# Patient Record
Sex: Female | Born: 1990 | Race: White | Hispanic: No | Marital: Married | Smoking: Former smoker
Health system: Southern US, Community
[De-identification: ages and names within clinical notes are randomized; demographics above are authoritative.]

## PROBLEM LIST (undated history)

## (undated) DIAGNOSIS — Z9889 Other specified postprocedural states: Secondary | ICD-10-CM

## (undated) DIAGNOSIS — F419 Anxiety disorder, unspecified: Secondary | ICD-10-CM

## (undated) DIAGNOSIS — K219 Gastro-esophageal reflux disease without esophagitis: Secondary | ICD-10-CM

## (undated) DIAGNOSIS — F329 Major depressive disorder, single episode, unspecified: Secondary | ICD-10-CM

## (undated) DIAGNOSIS — T8859XA Other complications of anesthesia, initial encounter: Secondary | ICD-10-CM

## (undated) DIAGNOSIS — Z720 Tobacco use: Secondary | ICD-10-CM

## (undated) DIAGNOSIS — E669 Obesity, unspecified: Secondary | ICD-10-CM

## (undated) DIAGNOSIS — E785 Hyperlipidemia, unspecified: Secondary | ICD-10-CM

## (undated) DIAGNOSIS — F32A Depression, unspecified: Secondary | ICD-10-CM

## (undated) DIAGNOSIS — J45909 Unspecified asthma, uncomplicated: Secondary | ICD-10-CM

## (undated) DIAGNOSIS — R079 Chest pain, unspecified: Secondary | ICD-10-CM

## (undated) DIAGNOSIS — Z5189 Encounter for other specified aftercare: Secondary | ICD-10-CM

## (undated) DIAGNOSIS — M5412 Radiculopathy, cervical region: Secondary | ICD-10-CM

## (undated) DIAGNOSIS — D649 Anemia, unspecified: Secondary | ICD-10-CM

## (undated) HISTORY — DX: Depression, unspecified: F32.A

## (undated) HISTORY — DX: Unspecified asthma, uncomplicated: J45.909

## (undated) HISTORY — DX: Gastro-esophageal reflux disease without esophagitis: K21.9

## (undated) HISTORY — DX: Major depressive disorder, single episode, unspecified: F32.9

## (undated) HISTORY — DX: Anxiety disorder, unspecified: F41.9

## (undated) HISTORY — DX: Encounter for other specified aftercare: Z51.89

## (undated) HISTORY — DX: Hyperlipidemia, unspecified: E78.5

---

## 2011-06-20 ENCOUNTER — Ambulatory Visit (INDEPENDENT_AMBULATORY_CARE_PROVIDER_SITE_OTHER): Payer: BC Managed Care – PPO

## 2011-06-20 DIAGNOSIS — R509 Fever, unspecified: Secondary | ICD-10-CM

## 2011-06-20 DIAGNOSIS — J40 Bronchitis, not specified as acute or chronic: Secondary | ICD-10-CM

## 2011-06-20 DIAGNOSIS — R05 Cough: Secondary | ICD-10-CM

## 2011-06-20 DIAGNOSIS — F17200 Nicotine dependence, unspecified, uncomplicated: Secondary | ICD-10-CM

## 2011-06-27 ENCOUNTER — Ambulatory Visit (INDEPENDENT_AMBULATORY_CARE_PROVIDER_SITE_OTHER): Payer: BC Managed Care – PPO | Admitting: Family Medicine

## 2011-06-27 VITALS — BP 108/66 | HR 76 | Temp 98.9°F | Resp 18 | Ht 63.0 in | Wt 160.0 lb

## 2011-06-27 DIAGNOSIS — N912 Amenorrhea, unspecified: Secondary | ICD-10-CM

## 2011-06-27 DIAGNOSIS — J45909 Unspecified asthma, uncomplicated: Secondary | ICD-10-CM | POA: Insufficient documentation

## 2011-06-27 NOTE — Patient Instructions (Addendum)
Nosebleed Nosebleeds can be caused by many conditions including trauma, infections, polyps, foreign bodies, dry mucous membranes or climate, medications and air conditioning. Most nosebleeds occur in the front of the nose. It is because of this location that most nosebleeds can be controlled by pinching the nostrils gently and continuously. Do this for at least 10 to 20 minutes. The reason for this long continuous pressure is that you must hold it long enough for the blood to clot. If during that 10 to 20 minute time period, pressure is released, the process may have to be started again. The nosebleed may stop by itself, quit with pressure, need concentrated heating (cautery) or stop with pressure from packing. HOME CARE INSTRUCTIONS   If your nose was packed, try to maintain the pack inside until your caregiver removes it. If a gauze pack was used and it starts to fall out, gently replace or cut the end off. Do not cut if a balloon catheter was used to pack the nose. Otherwise, do not remove unless instructed.   Avoid blowing your nose for 12 hours after treatment. This could dislodge the pack or clot and start bleeding again.   If the bleeding starts again, sit up and bending forward, gently pinch the front half of your nose continuously for 20 minutes.   If bleeding was caused by dry mucous membranes, cover the inside of your nose every morning with a petroleum or antibiotic ointment. Use your little fingertip as an applicator. Do this as needed during dry weather. This will keep the mucous membranes moist and allow them to heal.   Maintain humidity in your home by using less air conditioning or using a humidifier.   Do not use aspirin or medications which make bleeding more likely. Your caregiver can give you recommendations on this.   Resume normal activities as able but try to avoid straining, lifting or bending at the waist for several days.   If the nosebleeds become recurrent and the cause  is unknown, your caregiver may suggest laboratory tests.  SEEK IMMEDIATE MEDICAL CARE IF:   Bleeding recurs and cannot be controlled.   There is unusual bleeding from or bruising on other parts of the body.   You have a fever.   Nosebleeds continue.   There is any worsening of the condition which originally brought you in.   You become lightheaded, feel faint, become sweaty or vomit blood.  MAKE SURE YOU:   Understand these instructions.   Will watch your condition.   Will get help right away if you are not doing well or get worse.  Document Released: 02/21/2005 Document Revised: 01/24/2011 Document Reviewed: 04/15/2009 [facility] Patient Information 2012 [place], [place].Pregnancy Tests HOW DO PREGNANCY TESTS WORK? All pregnancy tests look for a special hormone in the urine or blood that is only present in pregnant women. This hormone, human chorionic gonadotropin (hCG), is also called the pregnancy hormone.  WHAT IS THE DIFFERENCE BETWEEN A URINE AND A BLOOD PREGNANCY TEST? IS ONE BETTER THAN THE OTHER? There are two types of pregnancy tests.  Blood tests.   Urine tests.  Both tests look for the presence of hCG, the pregnancy hormone. Many women use a urine test or home pregnancy test (HPT) to find out if they are pregnant. HPTs are cheap, easy to use, can be done at home, and are private. When a woman has a positive result on an HPT, she needs to see her caregiver right away. The caregiver can confirm  a positive HPT result with another urine test, a blood test, ultrasound, and a pelvic exam.  There are two types of blood tests you can get from a caregiver.   A quantitative blood test (or the beta hCG test). This test measures the exact amount of hCG in the blood. This means it can pick up very small amounts of hCG, making it a very accurate test.   A qualitative hCG blood test. This test gives a simple yes or no answer to whether you are pregnant. This test is more like a urine  test in terms of its accuracy.  Blood tests can pick up hCG earlier in a pregnancy than urine tests can. Blood tests can tell if you are pregnant about 6 to 8 days after you release an egg from an ovary (ovulate). Urine tests can determine pregnancy about 2 weeks after ovulation. Some more sensitive urine tests can tell if you are pregnant as early as 6 days or even 1 day after you miss a menstrual period.  HOW IS A HOME PREGNANCY TEST DONE?  There are many types of home pregnancy tests or HPTs that can be bought over-the-counter at drug or discount stores.   Some involve collecting your urine in a cup and dipping a stick into the urine or putting some of the urine into a special container with an eyedropper.   Others are done by placing a stick into your urine stream.   Tests vary in how long you need to wait for the stick or container to turn a certain color or have a symbol on it (like a plus or a minus).   All tests come with written instructions. Most tests also have toll-free phone numbers to call if you have any questions about how to do the test or read the results.  HOW ACCURATE ARE HOME PREGNANCY TESTS?  HPTs are very accurate. Most brands of HPTs say they are 97% to 99% accurate when taken 1 week after missing your menstrual period, but this can vary with actual use. Each brand varies in how sensitive it is in picking up the pregnancy hormone hCG. If a test is not done correctly, it will be less accurate. Always check the package to make sure it is not past its expiration date. If it is, it will not be accurate. Most brands of HPTs tell users to do the test again in a few days, no matter what the results.  If you use an HPT too early in your pregnancy, you may not have enough of the pregnancy hormone hCG in your urine to have a positive test result. Most HPTs will be accurate if you test yourself around the time your period is due (about 2 weeks after you ovulate). You can get a negative  test result if you are not pregnant or if you ovulated later than you thought you did. You may also have problems with the pregnancy, which affects the amount of hCG you have in your urine. If your HPT is negative, test yourself again within a few days to 1 week. If you keep getting a negative result and think you are pregnant, talk with your caregiver right away about getting a blood pregnancy test.  FALSE POSITIVE PREGNANCY TEST A false positive HPT can happen if there is blood or protein present in your urine. A false positive can also happen if you were recently pregnant or if you take a pregnancy test too soon after taking fertility drug  that contains hCG. Also, some prescription medicines such as water pills (diuretics), tranquilizers, seizure medicines, psychiatric medicines, and allergy and nausea medicines (promethazine) give false positive readings. FALSE NEGATIVE PREGNANCY TEST  A false negative HPT can happen if you do the test too early. Try to wait until you are at least 1 day late for your menstrual period.   It may happen if you wait too long to test the urine (longer than 15 minutes).   It may also happen if the urine is too diluted because you drank a lot of fluids before getting the urine sample. It is best to test the first morning urine after you get out of bed.  If your menstrual period did not start after a week of a negative HPT, repeat the pregnancy test. CAN ANYTHING INTERFERE WITH HOME PREGNANCY TEST RESULTS?  Most medicines, both over-the-counter and prescription drugs, including birth control pills and antibiotics, should not affect the results of a HPT. Only those drugs that have the pregnancy hormone hCG in them can give a false positive test result. Drugs that have hCG in them may be used for treating infertility (not being able to get pregnant). Alcohol and illegal drugs do not affect HPT results, but you should not be using these substances if you are trying to get  pregnant. If you have a positive pregnancy test, call your caregiver to make an appointment to begin prenatal care. Document Released: 05/17/2003 Document Revised: 01/24/2011 Document Reviewed: 07/28/2010 [facility] Patient Information 2012 [place], [place].

## 2011-06-27 NOTE — Progress Notes (Signed)
  Subjective:    Patient ID: Molly Gibbs, female    DOB: 01-23-1991, 23 y.o.   MRN: [id number]  Epistaxis  The bleeding has been from the right nare. This is a new problem. The current episode started in the past 7 days. The problem occurs daily. The problem has been unchanged. The bleeding is associated with nothing. She has tried nothing for the symptoms. There is no history of allergies, a bleeding disorder or frequent nosebleeds.   Patient has also had some insomnia and amenorrhea x 2 months but feels that her insomnia doesn't need treatment (she has taken the oxycodone for help sleeping).   Review of Systems  HENT: Positive for nosebleeds.   Gastrointestinal: Abdominal distention: Patient has had nausea and vomiting.  All other systems reviewed and are negative.       Objective:   Physical Exam  Constitutional: She appears well-developed and well-nourished.  Eyes: Conjunctivae are normal. Pupils are equal, round, and reactive to light.  Neck: Normal range of motion. Neck supple.  Abdominal: Soft. She exhibits no distension and no mass. There is no tenderness. There is no rebound and no guarding.  Skin: Skin is warm.          Assessment & Plan:  Epistaxis:  Uncertain cause but appears to be under control now Nausea and vomiting are mild, just in am:  Cause may be the oxycodone. Amenorrhea:  If this persists one more week, patient will call me and I will call in some treatment

## 2011-06-28 ENCOUNTER — Encounter: Payer: Self-pay | Admitting: Family Medicine

## 2011-06-28 LAB — HCG, SERUM, QUALITATIVE: Preg, Serum: NEGATIVE

## 2012-06-27 ENCOUNTER — Ambulatory Visit: Payer: BC Managed Care – PPO

## 2012-06-27 ENCOUNTER — Ambulatory Visit (INDEPENDENT_AMBULATORY_CARE_PROVIDER_SITE_OTHER): Payer: BC Managed Care – PPO | Admitting: Family Medicine

## 2012-06-27 ENCOUNTER — Encounter: Payer: Self-pay | Admitting: Family Medicine

## 2012-06-27 VITALS — BP 102/68 | HR 69 | Temp 98.1°F | Resp 16 | Ht 63.0 in | Wt 155.4 lb

## 2012-06-27 DIAGNOSIS — F17200 Nicotine dependence, unspecified, uncomplicated: Secondary | ICD-10-CM

## 2012-06-27 DIAGNOSIS — Z720 Tobacco use: Secondary | ICD-10-CM

## 2012-06-27 DIAGNOSIS — M25569 Pain in unspecified knee: Secondary | ICD-10-CM | POA: Insufficient documentation

## 2012-06-27 LAB — CBC WITH DIFFERENTIAL/PLATELET
Basophils Absolute: 0 10*3/uL (ref 0.0–0.1)
Basophils Relative: 1 % (ref 0–1)
MCHC: 33.9 g/dL (ref 30.0–36.0)
Monocytes Absolute: 0.4 10*3/uL (ref 0.1–1.0)
Neutro Abs: 3.3 10*3/uL (ref 1.7–7.7)
Neutrophils Relative %: 60 % (ref 43–77)
RDW: 12.7 % (ref 11.5–15.5)

## 2012-06-27 LAB — COMPREHENSIVE METABOLIC PANEL
ALT: 9 U/L (ref 0–35)
Albumin: 4.4 g/dL (ref 3.5–5.2)
BUN: 15 mg/dL (ref 6–23)
CO2: 27 mEq/L (ref 19–32)
Calcium: 9.6 mg/dL (ref 8.4–10.5)
Chloride: 106 mEq/L (ref 96–112)
Creat: 0.61 mg/dL (ref 0.50–1.10)
Potassium: 4.5 mEq/L (ref 3.5–5.3)

## 2012-06-27 LAB — RHEUMATOID FACTOR: Rhuematoid fact SerPl-aCnc: <10 IU/mL (ref <=14)

## 2012-06-27 LAB — SEDIMENTATION RATE: Sed Rate: 9 mm/hr (ref 0–22)

## 2012-06-27 MED ORDER — PREDNISONE (PAK) 5 MG PO TABS: 5.0000 mg | ORAL_TABLET | Freq: Four times a day (QID) | ORAL | Status: DC

## 2012-06-27 MED ORDER — MELOXICAM 15 MG PO TABS: 15.0000 mg | ORAL_TABLET | Freq: Every day | ORAL | Status: DC

## 2012-06-27 NOTE — Patient Instructions (Signed)
Prednisone dose pack has been prescribed for knee pain. On day 1- take 1 tablet 4 times a day with food. Take 1 less tablet each day until you have no tablets left. You have enough tablets for 4 days. Meloxicam is for pain and inflammation; take 1 tablet daily with food or snack. Do not take Ibuprofen while you are taking this medication.  You can also apply topical pain medication like Arnica Gel or Tiger Balm 2-3 times daily.

## 2012-06-27 NOTE — Progress Notes (Signed)
S:  This 22 y.o. Cauc female has new onset of bilateral knee pain, R>L. She served in the [employer] for 5 years and had no problems with joint pain. Currently, her job involves admin work w/ [employer]; she does a lot of walking around the [employer]. She exercises 1-2 times/week, doing cardio. Advil relieves the deep ache in her knees, but not the sharp pains. She c/o "soreness" in her hands but no hip, ankle, elbow or other major joint discomfort. She denies redness, swelling or warmth in joints. She has not been febrile or had HA, rash, n/v/d or any other symptoms.  Family HX is significant for Rheumatoid arthritis and OA.  O:  Filed Vitals:   06/27/12 0812  BP: 102/68  Pulse: 69  Temp: 98.1 F (36.7 C)  Resp: 16   GEN: In NAD; WN/WD. HENT: [place]/AT; EOMI w/ clear conj/sclerae. Oroph clear and moist. COR: RRR. LUNGS: Normal resp rate and effort. SKIN: W&D. No rashes or pallor. MS: R knee- normal appearance; no effusion, redness or warmth. Tender w/ minimal palpation at joint line and above patella; + patella compression test. FROM. No ligament laxity or joint instability.        L  Knee- normal appearance; no effusion, redness or warmth. No tender areas. Negative patella compression test. No ligament laxity or joint instability.        Pt does not bear weight fully on R leg.        Other major joints are normal. NEURO: A&O x 3; CNs intact. Nonfocal.   [facility] reading (PRIMARY) by  Dr. Audria Nine: R knee- no fracture or dislocation. Normal.   A/P:  1. Knee pain  DG Knee 1-2 Views Right, Comprehensive metabolic panel, Sedimentation Rate, Vitamin D, 25-hydroxy, CBC with Differential, Rheumatoid factor  2. Tobacco use - pt started smoking Jan 2013. Encouraged cessation.    Meds ordered this encounter  Medications         . meloxicam (MOBIC) 15 MG tablet    Sig: Take 1 tablet (15 mg total) by mouth daily.    Dispense:  30 tablet    Refill:  0  . predniSONE (STERAPRED UNI-PAK) 5  MG TABS    Sig: Take 1 tablet (5 mg total) by mouth taper from 4 doses each day to 1 dose and stop. Take all doses with meal or snack.    Dispense:  10 tablet    Refill:  0

## 2012-06-29 ENCOUNTER — Other Ambulatory Visit: Payer: Self-pay | Admitting: Family Medicine

## 2012-06-29 MED ORDER — ERGOCALCIFEROL 1.25 MG (50000 UT) PO CAPS: 50000.0000 [IU] | ORAL_CAPSULE | ORAL | Status: DC

## 2012-06-29 NOTE — Progress Notes (Signed)
Quick Note:  Please call pt and advise that the following labs are abnormal... Vitamin level is very low; I am prescribing Vitamin D 91478 units to be taken once a week. This medication is at her pharmacy for pick-up.  All other lab tests are normal; blood test for Rheumatoid arthritis is negative.  Copy to pt. ______

## 2012-07-11 ENCOUNTER — Ambulatory Visit: Payer: BC Managed Care – PPO | Admitting: Family Medicine

## 2012-10-13 ENCOUNTER — Other Ambulatory Visit: Payer: Self-pay

## 2012-10-24 ENCOUNTER — Other Ambulatory Visit (HOSPITAL_COMMUNITY): Payer: Self-pay | Admitting: Obstetrics

## 2012-10-24 DIAGNOSIS — O2691 Pregnancy related conditions, unspecified, first trimester: Secondary | ICD-10-CM

## 2012-10-27 ENCOUNTER — Ambulatory Visit (HOSPITAL_COMMUNITY)
Admission: RE | Admit: 2012-10-27 | Discharge: 2012-10-27 | Disposition: A | Discharge status: Home / Self Care | Payer: BC Managed Care – PPO | Source: Ambulatory Visit | Attending: Obstetrics | Admitting: Obstetrics

## 2012-10-27 ENCOUNTER — Encounter (HOSPITAL_COMMUNITY): Payer: Self-pay

## 2012-10-27 ENCOUNTER — Other Ambulatory Visit (HOSPITAL_COMMUNITY): Payer: Self-pay | Admitting: Obstetrics

## 2012-10-27 ENCOUNTER — Other Ambulatory Visit: Payer: Self-pay | Admitting: Obstetrics

## 2012-10-27 DIAGNOSIS — O2691 Pregnancy related conditions, unspecified, first trimester: Secondary | ICD-10-CM

## 2012-10-27 DIAGNOSIS — O209 Hemorrhage in early pregnancy, unspecified: Secondary | ICD-10-CM | POA: Insufficient documentation

## 2012-10-27 DIAGNOSIS — O34219 Maternal care for unspecified type scar from previous cesarean delivery: Secondary | ICD-10-CM | POA: Insufficient documentation

## 2012-10-27 NOTE — Progress Notes (Signed)
MFM Staff Consult Note  I saw and evaluated your patient, Molly Gibbs, today in the office.  We performed an ultrasound that demonstrated the following impressions:  1. Nonviable Singleton IUP  2. CRL measures less than dates as established by early ultrasound 3. No fetal heart motion is present as confirmed by color angiography and m-mode 4. Multiple cystic spaces in placenta leading to a suspicion for aneuploidy or partial molar pregnancy 5. Review of BHCG (1517 on 5/14, 1707 on 5/16, 3543 on 5/22, 4148 on 5/23) in context of images lead to high suspicion for intrinsic fetal disease/aneuploidy  Recommendations:  Today's findings and recommendations were reviewed and discussed with your patient at bedside in our unit.  She reported some vaginal bleeding over the weekend but simply some light spotting today.  I contacted Dr. Ernestina Penna to discuss my recommendation for D&C replete with tissue being sent to pathology for analysis and karyotyping.  Dr. Ernestina Penna agreed with such plan and will schedule within the next day or so with the patient (ie, by the end of this week at the latest). Management will be predicated by karyotype (eg, partial molar pregnancy versus aneuploidy), noting our practice will remain available for further questions.  Thank you for consultation.  It was a pleasure seeing your patient in the office.  See [facility].  Rogelia Boga, MD, MS, [facility]

## 2012-10-28 ENCOUNTER — Encounter (HOSPITAL_COMMUNITY): Payer: Self-pay | Admitting: Pharmacist

## 2012-10-29 ENCOUNTER — Ambulatory Visit (HOSPITAL_COMMUNITY)
Admission: RE | Admit: 2012-10-29 | Discharge: 2012-10-29 | Disposition: A | Discharge status: Home / Self Care | Payer: BC Managed Care – PPO | Source: Ambulatory Visit | Attending: Obstetrics | Admitting: Obstetrics

## 2012-10-29 ENCOUNTER — Ambulatory Visit (HOSPITAL_COMMUNITY): Payer: BC Managed Care – PPO | Admitting: Certified Registered"

## 2012-10-29 ENCOUNTER — Encounter (HOSPITAL_COMMUNITY): Payer: Self-pay | Admitting: *Deleted

## 2012-10-29 ENCOUNTER — Encounter (HOSPITAL_COMMUNITY)
Admission: RE | Disposition: A | Discharge status: Home / Self Care | Payer: Self-pay | Source: Ambulatory Visit | Attending: Obstetrics

## 2012-10-29 ENCOUNTER — Ambulatory Visit (HOSPITAL_COMMUNITY): Payer: BC Managed Care – PPO

## 2012-10-29 ENCOUNTER — Encounter (HOSPITAL_COMMUNITY): Payer: Self-pay | Admitting: Certified Registered"

## 2012-10-29 ENCOUNTER — Other Ambulatory Visit: Payer: Self-pay | Admitting: Obstetrics

## 2012-10-29 DIAGNOSIS — O021 Missed abortion: Secondary | ICD-10-CM | POA: Insufficient documentation

## 2012-10-29 HISTORY — PX: DILATION AND EVACUATION: SHX1459

## 2012-10-29 LAB — CBC
HCT: 36.3 % (ref 36.0–46.0)
MCH: 30 pg (ref 26.0–34.0)
MCV: 87.1 fL (ref 78.0–100.0)
RDW: 12.3 % (ref 11.5–15.5)
WBC: 7.1 10*3/uL (ref 4.0–10.5)

## 2012-10-29 LAB — TYPE AND SCREEN

## 2012-10-29 SURGERY — DILATION AND EVACUATION, UTERUS
Anesthesia: Monitor Anesthesia Care | Site: Uterus | Wound class: Clean Contaminated

## 2012-10-29 MED ORDER — HYDROCODONE-ACETAMINOPHEN 5-325 MG PO TABS: 2.0000 | ORAL_TABLET | Freq: Four times a day (QID) | ORAL | Status: DC | PRN

## 2012-10-29 MED ORDER — MIDAZOLAM HCL 2 MG/2ML IJ SOLN
INTRAMUSCULAR | Status: AC
  Filled 2012-10-29: qty 2

## 2012-10-29 MED ORDER — DOXYCYCLINE HYCLATE 100 MG IV SOLR: 100.0000 mg | Freq: Two times a day (BID) | INTRAVENOUS | Status: DC

## 2012-10-29 MED ORDER — ONDANSETRON HCL 4 MG/2ML IJ SOLN
INTRAMUSCULAR | Status: DC | PRN
  Administered 2012-10-29: 4 mg via INTRAVENOUS

## 2012-10-29 MED ORDER — DOXYCYCLINE HYCLATE 100 MG IV SOLR
250.0000 mg | INTRAVENOUS | Status: DC | PRN
  Administered 2012-10-29: 100 mg via INTRAVENOUS

## 2012-10-29 MED ORDER — DOXYCYCLINE HYCLATE 100 MG IV SOLR
100.0000 mg | Freq: Once | INTRAVENOUS | Status: DC
  Filled 2012-10-29 (×2): qty 100

## 2012-10-29 MED ORDER — FENTANYL CITRATE 0.05 MG/ML IJ SOLN: 25.0000 ug | INTRAMUSCULAR | Status: DC | PRN

## 2012-10-29 MED ORDER — SILVER NITRATE-POT NITRATE 75-25 % EX MISC
CUTANEOUS | Status: DC | PRN
  Administered 2012-10-29: 7

## 2012-10-29 MED ORDER — PROPOFOL 10 MG/ML IV EMUL
INTRAVENOUS | Status: AC
  Filled 2012-10-29: qty 20

## 2012-10-29 MED ORDER — LIDOCAINE HCL (CARDIAC) 20 MG/ML IV SOLN
INTRAVENOUS | Status: AC
Start: 2012-10-29 — End: 2012-10-29
  Filled 2012-10-29: qty 5

## 2012-10-29 MED ORDER — OXYCODONE-ACETAMINOPHEN 5-325 MG PO TABS: 2.0000 | ORAL_TABLET | ORAL | Status: DC | PRN

## 2012-10-29 MED ORDER — MEPERIDINE HCL 25 MG/ML IJ SOLN: 6.2500 mg | INTRAMUSCULAR | Status: DC | PRN

## 2012-10-29 MED ORDER — LIDOCAINE HCL (CARDIAC) 20 MG/ML IV SOLN
INTRAVENOUS | Status: DC | PRN
  Administered 2012-10-29: 80 mg via INTRAVENOUS
  Administered 2012-10-29: 20 mg via INTRAVENOUS

## 2012-10-29 MED ORDER — FENTANYL CITRATE 0.05 MG/ML IJ SOLN
INTRAMUSCULAR | Status: AC
  Filled 2012-10-29: qty 2

## 2012-10-29 MED ORDER — DEXAMETHASONE SODIUM PHOSPHATE 10 MG/ML IJ SOLN
INTRAMUSCULAR | Status: AC
  Filled 2012-10-29: qty 1

## 2012-10-29 MED ORDER — MIDAZOLAM HCL 2 MG/2ML IJ SOLN: 0.5000 mg | Freq: Once | INTRAMUSCULAR | Status: DC | PRN

## 2012-10-29 MED ORDER — KETOROLAC TROMETHAMINE 30 MG/ML IJ SOLN
INTRAMUSCULAR | Status: AC
  Administered 2012-10-29: 30 mg via INTRAVENOUS
  Filled 2012-10-29: qty 1

## 2012-10-29 MED ORDER — FENTANYL CITRATE 0.05 MG/ML IJ SOLN
INTRAMUSCULAR | Status: DC | PRN
  Administered 2012-10-29 (×2): 50 ug via INTRAVENOUS

## 2012-10-29 MED ORDER — LIDOCAINE HCL 1 % IJ SOLN
INTRAMUSCULAR | Status: DC | PRN
  Administered 2012-10-29: 20 mL

## 2012-10-29 MED ORDER — DEXAMETHASONE SODIUM PHOSPHATE 4 MG/ML IJ SOLN
INTRAMUSCULAR | Status: DC | PRN
  Administered 2012-10-29: 10 mg via INTRAVENOUS

## 2012-10-29 MED ORDER — MIDAZOLAM HCL 5 MG/5ML IJ SOLN
INTRAMUSCULAR | Status: DC | PRN
  Administered 2012-10-29 (×2): 2 mg via INTRAVENOUS

## 2012-10-29 MED ORDER — PROMETHAZINE HCL 25 MG/ML IJ SOLN: 6.2500 mg | INTRAMUSCULAR | Status: DC | PRN

## 2012-10-29 MED ORDER — ONDANSETRON HCL 4 MG/2ML IJ SOLN
INTRAMUSCULAR | Status: AC
  Filled 2012-10-29: qty 2

## 2012-10-29 MED ORDER — KETOROLAC TROMETHAMINE 30 MG/ML IJ SOLN: 15.0000 mg | Freq: Once | INTRAMUSCULAR | Status: AC | PRN

## 2012-10-29 MED ORDER — LACTATED RINGERS IV SOLN
INTRAVENOUS | Status: DC
Start: 2012-10-29 — End: 2012-10-29
  Administered 2012-10-29: 13:00:00 via INTRAVENOUS

## 2012-10-29 MED ORDER — PROPOFOL 10 MG/ML IV BOLUS
INTRAVENOUS | Status: DC | PRN
  Administered 2012-10-29 (×7): 20 mg via INTRAVENOUS

## 2012-10-29 SURGICAL SUPPLY — 24 items
CATH ROBINSON RED A/P 16FR (CATHETERS) ×2 IMPLANT
CLOTH BEACON ORANGE TIMEOUT ST (SAFETY) ×2 IMPLANT
DECANTER SPIKE VIAL GLASS SM (MISCELLANEOUS) ×4 IMPLANT
GLOVE BIO SURGEON STRL SZ 6.5 (GLOVE) ×2 IMPLANT
GLOVE BIOGEL PI IND STRL 7.0 (GLOVE) ×5 IMPLANT
GLOVE BIOGEL PI INDICATOR 7.0 (GLOVE) ×5
GLOVE SURG SS PI 6.5 STRL IVOR (GLOVE) ×14 IMPLANT
GLOVE SURG SS PI 7.0 STRL IVOR (GLOVE) ×6 IMPLANT
GOWN STRL REIN XL XLG (GOWN DISPOSABLE) ×4 IMPLANT
KIT BERKELEY 1ST TRIMESTER 3/8 (MISCELLANEOUS) ×2 IMPLANT
NEEDLE SPNL 22GX3.5 QUINCKE BK (NEEDLE) ×2 IMPLANT
NS IRRIG 1000ML POUR BTL (IV SOLUTION) ×2 IMPLANT
PACK VAGINAL MINOR WOMEN LF (CUSTOM PROCEDURE TRAY) ×2 IMPLANT
PAD OB MATERNITY 4.3X12.25 (PERSONAL CARE ITEMS) ×2 IMPLANT
PAD PREP 24X48 CUFFED NSTRL (MISCELLANEOUS) ×2 IMPLANT
SET BERKELEY SUCTION TUBING (SUCTIONS) ×2 IMPLANT
SYR BULB IRRIGATION 50ML (SYRINGE) ×2 IMPLANT
SYR CONTROL 10ML LL (SYRINGE) ×2 IMPLANT
TOWEL OR 17X24 6PK STRL BLUE (TOWEL DISPOSABLE) ×4 IMPLANT
VACURETTE 10 RIGID CVD (CANNULA) IMPLANT
VACURETTE 14MM CVD 1/2 BASE (CANNULA) ×2 IMPLANT
VACURETTE 7MM CVD STRL WRAP (CANNULA) ×2 IMPLANT
VACURETTE 8 RIGID CVD (CANNULA) IMPLANT
VACURETTE 9 RIGID CVD (CANNULA) IMPLANT

## 2012-10-29 NOTE — Anesthesia Preprocedure Evaluation (Signed)
Anesthesia Evaluation  Patient identified by MRN, date of birth, ID band Patient awake    Reviewed: Allergy & Precautions, H&P , Patient's Chart, lab work & pertinent test results, reviewed documented beta blocker date and time   History of Anesthesia Complications Negative for: history of anesthetic complications  Airway Mallampati: II TM Distance: >3 FB Neck ROM: full    Dental no notable dental hx.    Pulmonary neg pulmonary ROS, asthma ,  breath sounds clear to auscultation  Pulmonary exam normal       Cardiovascular Exercise Tolerance: Good negative cardio ROS  Rhythm:regular Rate:Normal     Neuro/Psych negative neurological ROS  negative psych ROS   GI/Hepatic negative GI ROS, Neg liver ROS,   Endo/Other  negative endocrine ROS  Renal/GU negative Renal ROS     Musculoskeletal   Abdominal   Peds  Hematology negative hematology ROS (+)   Anesthesia Other Findings   Reproductive/Obstetrics negative OB ROS                           Anesthesia Physical Anesthesia Plan  ASA: II  Anesthesia Plan: MAC   Post-op Pain Management:    Induction:   Airway Management Planned:   Additional Equipment:   Intra-op Plan:   Post-operative Plan:   Informed Consent: I have reviewed the patients History and Physical, chart, labs and discussed the procedure including the risks, benefits and alternatives for the proposed anesthesia with the patient or authorized representative who has indicated his/her understanding and acceptance.   Dental Advisory Given  Plan Discussed with: CRNA, Surgeon and Anesthesiologist  Anesthesia Plan Comments:         Anesthesia Quick Evaluation

## 2012-10-29 NOTE — Op Note (Signed)
PATIENT: Molly Gibbs 22 y.o. female  PRE-OPERATIVE DIAGNOSIS: missed abortion, possible partial molar pregnancy  POST-OPERATIVE DIAGNOSIS: missed abortion  PROCEDURE: Procedure(s) with comments:  DILATATION AND EVACUATION WITH ULTRASOUND (N/A) - CHROMOSOME STUDIES;POSSIBLE MOLAR PREGNANCY. Bladder instillation performed  SURGEON: Surgeon(s) and Role:  * Allante Whitmire A. Ernestina Penna, MD - Primary  PHYSICIAN ASSISTANT: none  ASSISTANTS: none  ANESTHESIA: MAC and local  EBL: 10cc  BLOOD ADMINISTERED:none  DRAINS: none  LOCAL MEDICATIONS USED: LIDOCAINE and Amount: 20 ml  SPECIMEN: Source of Specimen: POCs  DISPOSITION OF SPECIMEN: PATHOLOGY  COUNTS: YES  TOURNIQUET: * No tourniquets in log *  DICTATION: .Note written in EPIC  PLAN OF CARE: Discharge to home after PACU  PATIENT DISPOSITION: PACU - hemodynamically stable.  Delay start of Pharmacological VTE agent (>24hrs) due to surgical blood loss or risk of bleeding: yes  Antibiotic: 100 mg of IV doxycycline Findings: 8 weeks size uterus preprocedure, decreased uterine size post procedure with adequate hemostasis. thin endometrial stripe at  0.7cm with no flow suggestive retained products of conception Complications: None Indications: A 22 yo G2P1001 who presented for u/s guided D&C to evacuate MAB with abnormal cystic placenta. Decision made to proceed with operative management in OR due to small possibility of partial mole and associated risks of thyroid storm and hemorrhage.  Risks benefits were discussed with patient who agreed to proceed.   Procedure: After informed was obtained the patient was taken to the operating room where MAC anesthesia was initiated without difficulty. She was prepped and draped in the normal fashion in dorsal supine lithotomy position.  No bladder drainage was done due  to the desire for a full bladder for improved ultrasound guidance. A bimanual examination was performed to assess the size and the position of the  uterus. A sterile speculum was placed into the vagina. A single-tooth tenaculum was used to grasp the anterior lip of the cervix.  The cervix was serially dilated with Shawnie Pons dilators to a #21. A 7 French suction curet was advanced past the internal os under ultrasound guidance. Suction curettage was carried out. U/s visualization was limited due to retroflexion and bowel gas pattern. After 2 passes with the suction, vaginal probe u/s was used to show still a thickened endometrial lining. The bladder was then backfilled w/ 250cc saline and u/s guidance (abdominal) was then used with slightly improved visualization. Two additional passes were made until the uterine cavity was completely empty by noting a gritty cry with the suction currette. Repeat tranvaginal u/s was used to confirm complete uterine evacuation. Hemostasis was noted and the decision was made to end the procedure. Ultrasound was used to confirm a thin endometrial stripe and no blood flow. The tenaculum was removed. Pressure and silver nitrate were used to control the tenaculum site. Repeat bimanual examination was done to confirm good uterine tone and good hemostasis. The procedure was then terminated. Patient tolerated the procedure well sponge lap and needle count were correct x3. Patient seems recovery room in stable condition.  Morrison Masser A. 04/12/2012 10:53 AM

## 2012-10-29 NOTE — H&P (Signed)
See scanned H&P  In short G2P1 w/ MAB at 6 wks. Pt has been followed for low rise to beta and abnl development of IUP w/ large cystic placenta. Diagnosed with MAB earlier this wk. Nl TSH, beta not c/w molar preg.  Nl CBC  Exam per H&P  A/P: D&C, u/s guided, chromosomal studies.  R/B of procedure d/w pt.

## 2012-10-29 NOTE — Anesthesia Postprocedure Evaluation (Signed)
  Anesthesia Post-op Note  Anesthesia Post Note  Patient: Molly Gibbs  Procedure(s) Performed: Procedure(s) (LRB): DILATATION AND EVACUATION WITH ULTRASOUND (N/A)  Anesthesia type: MAC  Patient location: PACU  Post pain: Pain level controlled  Post assessment: Post-op Vital signs reviewed  Last Vitals:  Filed Vitals:   10/29/12 1500  BP: 93/67  Pulse: 71  Temp: 36.9 C  Resp: 14    Post vital signs: Reviewed  Level of consciousness: sedated  Complications: No apparent anesthesia complications

## 2012-10-29 NOTE — Transfer of Care (Signed)
Immediate Anesthesia Transfer of Care Note  Patient: Leonor Liv  Procedure(s) Performed: Procedure(s) with comments: DILATATION AND EVACUATION WITH ULTRASOUND (N/A) - CHROMOSOME STUDIES;POSSIBLE MOLAR PREGNANCY.   Bladder instillation performed  Patient Location: PACU  Anesthesia Type:MAC  Level of Consciousness: awake, alert  and oriented  Airway & Oxygen Therapy: Patient Spontanous Breathing  Post-op Assessment: Report given to PACU RN, Post -op Vital signs reviewed and stable and Patient moving all extremities  Post vital signs: Reviewed and stable  Complications: No apparent anesthesia complications

## 2012-10-29 NOTE — Brief Op Note (Signed)
10/29/2012  10:59 PM  PATIENT:  Leonor Liv  22 y.o. female   PRE-OPERATIVE DIAGNOSIS:  missed abortion, possible partial molar pregnancy  POST-OPERATIVE DIAGNOSIS:  missed abortion  PROCEDURE:  Procedure(s) with comments: DILATATION AND EVACUATION WITH ULTRASOUND (N/A) - CHROMOSOME STUDIES;POSSIBLE MOLAR PREGNANCY.   Bladder instillation performed  SURGEON:  Surgeon(s) and Role:    * Rolando Hessling A. Ernestina Penna, MD - Primary  PHYSICIAN ASSISTANT: none  ASSISTANTS: none   ANESTHESIA:   MAC and local  EBL:   10cc  BLOOD ADMINISTERED:none  DRAINS: none   LOCAL MEDICATIONS USED:  LIDOCAINE  and Amount: 20 ml  SPECIMEN:  Source of Specimen:  POCs  DISPOSITION OF SPECIMEN:  PATHOLOGY  COUNTS:  YES  TOURNIQUET:  * No tourniquets in log *  DICTATION: .Note written in EPIC  PLAN OF CARE: Discharge to home after PACU  PATIENT DISPOSITION:  PACU - hemodynamically stable.   Delay start of Pharmacological VTE agent (>24hrs) due to surgical blood loss or risk of bleeding: yes

## 2012-10-29 NOTE — Preoperative (Signed)
Beta Blockers   Reason not to administer Beta Blockers:Not Applicable 

## 2012-10-30 ENCOUNTER — Encounter (HOSPITAL_COMMUNITY): Payer: Self-pay | Admitting: Obstetrics

## 2013-02-02 ENCOUNTER — Ambulatory Visit (INDEPENDENT_AMBULATORY_CARE_PROVIDER_SITE_OTHER): Payer: BC Managed Care – PPO | Admitting: Family Medicine

## 2013-02-02 VITALS — BP 104/64 | HR 88 | Temp 98.0°F | Resp 17 | Ht 68.0 in | Wt 157.0 lb

## 2013-02-02 DIAGNOSIS — J029 Acute pharyngitis, unspecified: Secondary | ICD-10-CM

## 2013-02-02 MED ORDER — AMOXICILLIN 875 MG PO TABS: 875.0000 mg | ORAL_TABLET | Freq: Two times a day (BID) | ORAL | Status: DC

## 2013-02-02 NOTE — Progress Notes (Signed)
Patient ID: Molly Gibbs MRN: [id number], DOB: Jan 18, 1991, 22 y.o. Date of Encounter: 02/02/2013, 9:55 AM  Primary Physician: Lucilla Edin, MD  Chief Complaint:  Chief Complaint  Patient presents with  . Headache  . Sore Throat  . Nausea    HPI: 22 y.o. 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 Subjective fever and chills. No cough, congestion, rhinorrhea, sinus pressure, otalgia, or headache. Normal hearing. No GI complaints. Able to swallow saliva, but hurts to do so. Decreased appetite secondary to sore throat.   Past Medical History  Diagnosis Date  . Depression   . Asthma   . Anxiety   . Blood transfusion without reported diagnosis   . GERD (gastroesophageal reflux disease)      Home Meds: Prior to Admission medications   Medication Sig Start Date End Date Taking? Authorizing Provider  ibuprofen (ADVIL,MOTRIN) 200 MG tablet Take 400 mg by mouth every 6 (six) hours as needed for pain (for migraines).   Yes Historical Provider, MD    Allergies:  Allergies  Allergen Reactions  . [facility] Allergy]   . Codeine     swelling    History   Social History  . Marital Status: Married    Spouse Name: N/A    Number of Children: N/A  . Years of Education: N/A   Occupational History  . Not on file.   Social History Main Topics  . Smoking status: Current Every Day Smoker -- 0.50 packs/day for 2 years    Types: Cigarettes  . Smokeless tobacco: Not on file  . Alcohol Use: No  . Drug Use: No  . Sexual Activity: Yes    Birth Control/ Protection: Inserts   Other Topics Concern  . Not on file   Social History Narrative  . No narrative on file     Review of Systems: Constitutional: negative for weight changes.  Positive for sweats and fever HEENT: see above Cardiovascular: negative for chest pain or palpitations Respiratory: negative for hemoptysis, wheezing, or shortness of breath Abdominal: negative for  abdominal pain, nausea, vomiting or diarrhea Dermatological: negative for rash Neurologic: negative for headache   Physical Exam: Blood pressure 104/64, pulse 88, temperature 98 F (36.7 C), temperature source Oral, resp. rate 17, height 5\' 8"  (1.727 m), weight 157 lb (71.215 kg), SpO2 99.00%., Body mass index is 23.88 kg/(m^2). General: Well developed, well nourished, in no acute distress. Head: Normocephalic, atraumatic, eyes without discharge, sclera non-icteric, nares are patent. Bilateral auditory canals clear, TM's are without perforation, pearly grey with reflective cone of light bilaterally. No sinus TTP. Oral cavity moist, dentition normal. Posterior pharynx with post nasal drip and mild erythema. No peritonsillar abscess or tonsillar exudate. Neck: Supple. No thyromegaly. Full ROM. No lymphadenopathy. Lungs: Clear bilaterally to auscultation without wheezes, rales, or rhonchi. Breathing is unlabored. Heart: RRR with S1 S2. No murmurs, rubs, or gallops appreciated. Abdomen: Soft, non-tender, non-distended with normoactive bowel sounds. No hepatomegaly. No rebound/guarding. No obvious abdominal masses. Msk:  Strength and tone normal for age. Extremities: No clubbing or cyanosis. No edema. Neuro: Alert and oriented X 3. Moves all extremities spontaneously. CNII-XII grossly in tact. Psych:  Responds to questions appropriately with a normal affect.   Labs:   ASSESSMENT AND PLAN:  22 y.o. year old female with pharyngitis Sore throat - Plan: Culture, Group A Strep, amoxicillin (AMOXIL) 875 MG tablet  -Tylenol/Motrin prn -Rest/fluids -RTC precautions -RTC 3-5 days  if no improvement  Signed, Elvina Sidle, MD 02/02/2013 9:55 AM

## 2013-02-04 LAB — CULTURE, GROUP A STREP: Organism ID, Bacteria: NORMAL

## 2013-04-07 ENCOUNTER — Encounter: Payer: Self-pay | Admitting: Family Medicine

## 2013-06-24 ENCOUNTER — Ambulatory Visit (INDEPENDENT_AMBULATORY_CARE_PROVIDER_SITE_OTHER): Payer: BC Managed Care – PPO | Admitting: Family Medicine

## 2013-06-24 VITALS — BP 102/70 | HR 99 | Temp 98.3°F | Resp 16 | Ht 63.5 in | Wt 149.0 lb

## 2013-06-24 DIAGNOSIS — R29898 Other symptoms and signs involving the musculoskeletal system: Secondary | ICD-10-CM

## 2013-06-24 DIAGNOSIS — R5381 Other malaise: Secondary | ICD-10-CM

## 2013-06-24 DIAGNOSIS — R5383 Other fatigue: Secondary | ICD-10-CM

## 2013-06-24 DIAGNOSIS — R55 Syncope and collapse: Secondary | ICD-10-CM

## 2013-06-24 DIAGNOSIS — R531 Weakness: Secondary | ICD-10-CM

## 2013-06-24 DIAGNOSIS — R51 Headache: Secondary | ICD-10-CM

## 2013-06-24 DIAGNOSIS — R42 Dizziness and giddiness: Secondary | ICD-10-CM

## 2013-06-24 LAB — POCT URINALYSIS DIPSTICK
Blood, UA: NEGATIVE
GLUCOSE UA: NEGATIVE
Ketones, UA: NEGATIVE
LEUKOCYTES UA: NEGATIVE
NITRITE UA: NEGATIVE
Protein, UA: 30
Spec Grav, UA: 1.015
Urobilinogen, UA: >=8
pH, UA: 8.5

## 2013-06-24 LAB — BASIC METABOLIC PANEL
BUN: 15 mg/dL (ref 6–23)
CALCIUM: 9.7 mg/dL (ref 8.4–10.5)
CO2: 29 mEq/L (ref 19–32)
Chloride: 105 mEq/L (ref 96–112)
Creat: 0.59 mg/dL (ref 0.50–1.10)
Glucose, Bld: 91 mg/dL (ref 70–99)
POTASSIUM: 4.4 meq/L (ref 3.5–5.3)
SODIUM: 140 meq/L (ref 135–145)

## 2013-06-24 LAB — POCT UA - MICROSCOPIC ONLY
Casts, Ur, LPF, POC: NEGATIVE
Crystals, Ur, HPF, POC: NEGATIVE
YEAST UA: NEGATIVE

## 2013-06-24 LAB — POCT CBC
GRANULOCYTE PERCENT: 66.5 % (ref 37–80)
HEMATOCRIT: 46.4 % (ref 37.7–47.9)
Hemoglobin: 14.5 g/dL (ref 12.2–16.2)
Lymph, poc: 2.1 (ref 0.6–3.4)
MCH, POC: 30 pg (ref 27–31.2)
MCHC: 31.3 g/dL — AB (ref 31.8–35.4)
MCV: 95.9 fL (ref 80–97)
MID (CBC): 0.6 (ref 0–0.9)
MPV: 12.7 fL (ref 0–99.8)
PLATELET COUNT, POC: 215 10*3/uL (ref 142–424)
POC GRANULOCYTE: 5.3 (ref 2–6.9)
POC LYMPH %: 26.5 % (ref 10–50)
POC MID %: 7 %M (ref 0–12)
RBC: 4.84 M/uL (ref 4.04–5.48)
RDW, POC: 12.6 %
WBC: 8 10*3/uL (ref 4.6–10.2)

## 2013-06-24 LAB — GLUCOSE, POCT (MANUAL RESULT ENTRY): POC GLUCOSE: 91 mg/dL (ref 70–99)

## 2013-06-24 LAB — POCT URINE PREGNANCY: Preg Test, Ur: NEGATIVE

## 2013-06-24 NOTE — Patient Instructions (Signed)
You should receive a call or letter about your lab results within the next week to 10 days.  We will refer you to neurologist, and schedule CT scan, but return to the clinic or go to the nearest emergency room if any of your symptoms worsen or new symptoms occur. Call your counselor to discuss your recent stressors, and work on coping techniques/relaxation techniques as discussed. If you feel that this is not helping, can return to discuss other treatments for stress/anxiety.  Let me know if you have any other questions.

## 2013-06-24 NOTE — Progress Notes (Addendum)
Subjective:    Patient ID: Molly Gibbs, female    DOB: Sep 22, 1990, 23 y.o.   MRN: [id number]  HPI Scribed for Trinna Post MD, the patient was seen in room 12. This chart was scribed by Lewanda Rife, ED scribe. Patient's care was started at 4:54 PM  HPI Comments: Hx was provided by the pt.  Molly Gibbs is a 24 y.o. female who presents to the [facility] with PMHx of anemia in 2011 during pregnancy complaining of intermittent dizziness onset today. Reports having 3 syncopal/dizziness episodes today. States she is "used to passing out", and multiple times with pregnancy. Reports associated unwitnessed LOC lasting a few seconds initial episode only, clammy hands, and  "flushed sensation" with syncopal episodes. Reports increased frequency of migraines for the past 2 weeks (recently every other day, but typically has migraines once every 3 months since age 20). Reports she was sitting down this morning "lied down" during first episode, walking to the bathroom and "lied down on the floor", and third episode "lied down in bedroom", with resolution of sx's. Describes dizziness as improving through out the day. Reports taking Excedrin and ibuprofen with relief of migraines. Denies associated fever, chest pain, shortness of breath, head injury, blood in mouth, biting tongue, palpitations, abdominal pain, melena, hematochezia, emesis, and nausea. Reports she is currently on Mirena.  Reports she is eating 3 meals a deal. Denies PMHx anorexia, bulmia, or other eating disorders. Denies PMHx of seizures. Reports increased stress recently. States she feels safe at home. Reports her husband left her 03/2013. Reports she had a miscarriage in June 2014. Reports she still is employed, but 2 off her bosses were fired last week. Reports she lives only with her daughter.   Stress mgt - sleep, smoking.  Production designer, theatre/television/film at [employer].   CBC 10/29/2012 Lab Results  Component Value Date   WBC 7.1  10/29/2012   HGB 12.5 10/29/2012   HCT 36.3 10/29/2012   MCV 87.1 10/29/2012   PLT 203 10/29/2012     Past Medical History  Diagnosis Date  . Depression   . Asthma   . Anxiety   . Blood transfusion without reported diagnosis   . GERD (gastroesophageal reflux disease)     Past Surgical History  Procedure Laterality Date  . Cesarean section    . Dilation and evacuation N/A 10/29/2012    Procedure: DILATATION AND EVACUATION WITH ULTRASOUND;  Surgeon: Alphonsus Sias. Ernestina Penna, MD;  Location: [facility];  Service: Gynecology;  Laterality: N/A;  CHROMOSOME STUDIES;POSSIBLE MOLAR PREGNANCY.   Bladder instillation performed    Family History  Problem Relation Age of Onset  . Arthritis Maternal Grandmother     Rheumatoid arthritis  . Cancer Maternal Grandmother     breast  . Arthritis Maternal Grandfather   . Arthritis Paternal Grandmother     Osteoarthritis  . Cancer Paternal Grandfather     lung  . Arthritis Paternal Grandfather     Osteoarthritis    History   Social History  . Marital Status: Married    Spouse Name: N/A    Number of Children: N/A  . Years of Education: N/A   Occupational History  . Not on file.   Social History Main Topics  . Smoking status: Current Every Day Smoker -- 0.50 packs/day for 2 years    Types: Cigarettes  . Smokeless tobacco: Not on file  . Alcohol Use: No  . Drug Use: No  . Sexual Activity: Yes  Birth Control/ Protection: Inserts   Other Topics Concern  . Not on file   Social History Narrative  . No narrative on file    Allergies  Allergen Reactions  . Codeine     swelling    Patient Active Problem List   Diagnosis Date Noted  . Knee pain 06/27/2012  . Asthma 06/27/2011    Review of Systems  Constitutional: Negative for fever.  Neurological: Positive for dizziness, syncope and headaches.       Objective:   Physical Exam  Vitals reviewed. Constitutional: She is oriented to person, place, and time. She appears well-developed and  well-nourished. No distress.  HENT:  Head: Normocephalic and atraumatic.  Eyes: Conjunctivae and EOM are normal. Pupils are equal, round, and reactive to light. Right eye exhibits no nystagmus. Left eye exhibits no nystagmus.  Neck: Neck supple. Carotid bruit is not present.    Cardiovascular: Normal rate, regular rhythm, normal heart sounds and intact distal pulses.  Exam reveals no friction rub.   No murmur heard. Pulmonary/Chest: Effort normal and breath sounds normal. No respiratory distress.  Abdominal: Soft. She exhibits no pulsatile midline mass. There is no tenderness.  Musculoskeletal: Normal range of motion.  Neurological: She is alert and oriented to person, place, and time. She has normal strength. No cranial nerve deficit or sensory deficit. Coordination and gait normal.  Patient speaks in full sentences. Cranial nerves III through XII grossly intact. Patient has equal grip strength bilaterally with 5/5 strength against resistance in her upper and lower extremities. Patient moves extremities without focal weakness. Negative Romberg.Normal finger to nose. No pronator drift.    Skin: Skin is warm and dry.  Psychiatric: She has a normal mood and affect. Her behavior is normal. Thought content normal.     Filed Vitals:   06/24/13 1530  BP: 102/70  Pulse: 99  Temp: 98.3 F (36.8 C)  TempSrc: Oral  Resp: 16  Height: 5' 3.5" (1.613 m)  Weight: 149 lb (67.586 kg)  SpO2: 99%   EKG: Sr, rate 66, no acute findings. QTC 374.    Results for orders placed in visit on 06/24/13  POCT CBC      Result Value Range   WBC 8.0  4.6 - 10.2 K/uL   Lymph, poc 2.1  0.6 - 3.4   POC LYMPH PERCENT 26.5  10 - 50 %L   MID (cbc) 0.6  0 - 0.9   POC MID % 7.0  0 - 12 %M   POC Granulocyte 5.3  2 - 6.9   Granulocyte percent 66.5  37 - 80 %G   RBC 4.84  4.04 - 5.48 M/uL   Hemoglobin 14.5  12.2 - 16.2 g/dL   HCT, POC 45.4  09.8 - 47.9 %   MCV 95.9  80 - 97 fL   MCH, POC 30.0  27 - 31.2 pg    MCHC 31.3 (*) 31.8 - 35.4 g/dL   RDW, POC 11.9     Platelet Count, POC 215  142 - 424 K/uL   MPV 12.7  0 - 99.8 fL  GLUCOSE, POCT (MANUAL RESULT ENTRY)      Result Value Range   POC Glucose 91  70 - 99 mg/dl  POCT UA - MICROSCOPIC ONLY      Result Value Range   WBC, Ur, HPF, POC 1-2     RBC, urine, microscopic 0-2     Bacteria, U Microscopic small     Mucus, UA 2+  Epithelial cells, urine per micros 10-20     Crystals, Ur, HPF, POC neg     Casts, Ur, LPF, POC neg     Yeast, UA neg    POCT URINALYSIS DIPSTICK      Result Value Range   Color, UA yellow     Clarity, UA cloudy     Glucose, UA neg     Bilirubin, UA small     Ketones, UA neg     Spec Grav, UA 1.015     Blood, UA neg     pH, UA 8.5     Protein, UA 30     Urobilinogen, UA >=8.0     Nitrite, UA neg     Leukocytes, UA Negative    POCT URINE PREGNANCY      Result Value Range   Preg Test, Ur Negative          Assessment & Plan:  Molly LivSydney Briles is a 23 y.o. female Syncope , Dizziness, Headache. - Plan: POCT CBC, POCT glucose (manual entry), POCT UA - Microscopic Only, POCT urinalysis dipstick, POCT urine pregnancy, Basic metabolic panel, TSH, EKG 12-Lead, Ambulatory referral to Neurology, CT Head Wo Contrast  -increased frequency of headaches, now with syncopal episode this am, with other nearsyncopal episodes. No seizure history, and episodes earlier today without features of seizure.  nonfocal neuro exam, reassuring EKG and CBC.  Will check TSH, BMP, refer to neuro for eval (also with reported hand weakness in am only - nonfocal on exam currently), and after discussion of symptoms and change in ha's, will check CT head.  Radiation risks discussed with CT scan. If syncope recurs - to ER/911/rtc precautions.   Discussed stress mgt, and possible contributor of stress to above. Plans to call counselor to setup appt, coping/relaxation techniques discussed, and if needed - rtc to discuss further - possible trial of  anxiolytic short term.   Patient Instructions  You should receive a call or letter about your lab results within the next week to 10 days.  We will refer you to neurologist, and schedule CT scan, but return to the clinic or go to the nearest emergency room if any of your symptoms worsen or new symptoms occur. Call your counselor to discuss your recent stressors, and work on coping techniques/relaxation techniques as discussed. If you feel that this is not helping, can return to discuss other treatments for stress/anxiety.  Let me know if you have any other questions.         I personally performed the services described in this documentation, which was scribed in my presence. The recorded information has been reviewed and considered, and addended by me as needed.

## 2013-06-25 LAB — TSH: TSH: 1.063 u[IU]/mL (ref 0.350–4.500)

## 2013-06-30 ENCOUNTER — Encounter: Payer: Self-pay | Admitting: Diagnostic Neuroimaging

## 2013-06-30 ENCOUNTER — Ambulatory Visit
Admission: RE | Admit: 2013-06-30 | Discharge: 2013-06-30 | Disposition: A | Discharge status: Home / Self Care | Payer: BC Managed Care – PPO | Source: Ambulatory Visit | Attending: Family Medicine | Admitting: Family Medicine

## 2013-06-30 ENCOUNTER — Ambulatory Visit (INDEPENDENT_AMBULATORY_CARE_PROVIDER_SITE_OTHER): Payer: BC Managed Care – PPO | Admitting: Diagnostic Neuroimaging

## 2013-06-30 VITALS — BP 98/72 | HR 80 | Temp 98.4°F | Ht 63.0 in | Wt 149.0 lb

## 2013-06-30 DIAGNOSIS — R42 Dizziness and giddiness: Secondary | ICD-10-CM

## 2013-06-30 DIAGNOSIS — G43109 Migraine with aura, not intractable, without status migrainosus: Secondary | ICD-10-CM

## 2013-06-30 DIAGNOSIS — R55 Syncope and collapse: Secondary | ICD-10-CM

## 2013-06-30 DIAGNOSIS — R51 Headache: Secondary | ICD-10-CM

## 2013-06-30 MED ORDER — TOPIRAMATE 50 MG PO TABS: 50.0000 mg | ORAL_TABLET | Freq: Two times a day (BID) | ORAL | Status: DC

## 2013-06-30 MED ORDER — SUMATRIPTAN SUCCINATE 100 MG PO TABS: 100.0000 mg | ORAL_TABLET | Freq: Once | ORAL | Status: DC | PRN

## 2013-06-30 NOTE — Progress Notes (Signed)
[facility]  PATIENT: Molly Gibbs DOB: 1991-03-10  REFERRING CLINICIAN: Neva SeatGreene HISTORY FROM: patient  REASON FOR VISIT: new consult   HISTORICAL  CHIEF COMPLAINT:  Chief Complaint  Patient presents with  . Dizziness    passing out spells  . Headache    migraines  . Fatigue    constantly tired    HISTORY OF PRESENT ILLNESS:   23 year old right-handed female with history of migraine anxiety, here for evaluation of headaches.  Patient had first migraine headaches in her life at age 23 years old. At that time she had occipital, throbbing, sharp headaches with nausea and photophobia. Headaches last hours at a time. She was averaging one day of headache every 3 months. Headaches were relieved with tramadol, ibuprofen or Excedrin Migraine.  Over last 3 months patient has had change of her headaches with increasing frequency. Now she has frontal, right or left sided sharp throbbing headaches with photophobia and phonophobia. No nausea or vomiting. She's having headaches every 2-3 days. She uses Excedrin migraine and ibuprofen, which slightly helps. She has a feeling of warning before the headaches with blurred vision and dizziness.  Triggering factors include smoking a cigarette, which is a new finding. Previously she she was smoking cigarettes without triggering headaches. Also patient has had significant insomnia and sleep deprivation since June 2014, when she had a miscarriage. Patient typically lays down to go to sleep around 10 PM, stays awake until midnight or 1 AM when she falls asleep. When she wakes up at 4:30 AM to get ready for work.  Other triggering factors include recent separation with husband, November 2014. Now patient is going through divorce.  Last Wednesday patient had 3 episodes of loss of consciousness at home, with prodromal lightheaded, dizzy, hot sensation.   REVIEW OF SYSTEMS: Full 14 system review of systems performed and notable only  for fatigue blurred vision spinning sensation shortness of breath feeling cold lipase muscle headache weakness dizziness passing out insomnia anxiety not asleep decreased energy racing thoughts.  ALLERGIES: Allergies  Allergen Reactions  . Codeine     swelling    HOME MEDICATIONS: Outpatient Prescriptions Prior to Visit  Medication Sig Dispense Refill  . ibuprofen (ADVIL,MOTRIN) 200 MG tablet Take 400 mg by mouth every 6 (six) hours as needed for pain (for migraines).      Marland Kitchen. amoxicillin (AMOXIL) 875 MG tablet Take 1 tablet (875 mg total) by mouth 2 (two) times daily.  20 tablet  0   No facility-administered medications prior to visit.    PAST MEDICAL HISTORY: Past Medical History  Diagnosis Date  . Depression   . Asthma   . Anxiety   . Blood transfusion without reported diagnosis   . GERD (gastroesophageal reflux disease)     PAST SURGICAL HISTORY: Past Surgical History  Procedure Laterality Date  . Cesarean section    . Dilation and evacuation N/A 10/29/2012    Procedure: DILATATION AND EVACUATION WITH ULTRASOUND;  Surgeon: Alphonsus SiasKelly A. Ernestina PennaFogleman, MD;  Location: [facility];  Service: Gynecology;  Laterality: N/A;  CHROMOSOME STUDIES;POSSIBLE MOLAR PREGNANCY.   Bladder instillation performed    FAMILY HISTORY: Family History  Problem Relation Age of Onset  . Arthritis Maternal Grandmother     Rheumatoid arthritis  . Cancer Maternal Grandmother     breast  . Arthritis Maternal Grandfather   . Arthritis Paternal Grandmother     Osteoarthritis  . Cancer Paternal Grandfather     lung  . Arthritis Paternal Grandfather  Osteoarthritis    SOCIAL HISTORY:  History   Social History  . Marital Status: Single    Spouse Name: N/A    Number of Children: 1  . Years of Education: College   Occupational History  .      [employer]   Social History Main Topics  . Smoking status: Current Every Day Smoker -- 0.50 packs/day for 2 years    Types: Cigarettes  . Smokeless  tobacco: Never Used  . Alcohol Use: Yes     Comment: occasionally 1 drink weekly  . Drug Use: No  . Sexual Activity: Yes    Birth Control/ Protection: Inserts   Other Topics Concern  . Not on file   Social History Narrative   Patient lives at home with her daughter.   Caffeine Use: 2 sodas daily     PHYSICAL EXAM  Filed Vitals:   06/30/13 0930 06/30/13 0935 06/30/13 0941  BP:  100/67 98/72  Pulse:  69 80  Temp: 98.4 F (36.9 C)    TempSrc: Oral    Height: 5\' 3"  (1.6 m)    Weight: 149 lb (67.586 kg)      Not recorded    Body mass index is 26.4 kg/(m^2).  GENERAL EXAM: Patient is in no distress; well developed, nourished and groomed; neck is supple  CARDIOVASCULAR: Regular rate and rhythm, no murmurs, no carotid bruits  NEUROLOGIC: MENTAL STATUS: awake, alert, oriented to person, place and time, recent and remote memory intact, normal attention and concentration, language fluent, comprehension intact, naming intact, fund of knowledge appropriate CRANIAL NERVE: no papilledema on fundoscopic exam, pupils equal and reactive to light, visual fields full to confrontation, extraocular muscles intact, no nystagmus, facial sensation and strength symmetric, hearing intact, palate elevates symmetrically, uvula midline, shoulder shrug symmetric, tongue midline. MOTOR: normal bulk and tone, full strength in the BUE, BLE SENSORY: normal and symmetric to light touch, pinprick, temperature, vibration COORDINATION: finger-nose-finger, fine finger movements, heel-shin normal REFLEXES: deep tendon reflexes present and symmetric GAIT/STATION: narrow based gait; able to walk on toes, heels and tandem; romberg is negative    DIAGNOSTIC DATA (LABS, IMAGING, TESTING) - I reviewed patient records, labs, notes, testing and imaging myself where available.  Lab Results  Component Value Date   WBC 8.0 06/24/2013   HGB 14.5 06/24/2013   HCT 46.4 06/24/2013   MCV 95.9 06/24/2013   PLT 203  10/29/2012      Component Value Date/Time   NA 140 06/24/2013 1734   K 4.4 06/24/2013 1734   CL 105 06/24/2013 1734   CO2 29 06/24/2013 1734   GLUCOSE 91 06/24/2013 1734   BUN 15 06/24/2013 1734   CREATININE 0.59 06/24/2013 1734   CALCIUM 9.7 06/24/2013 1734   PROT 6.9 06/27/2012 0919   ALBUMIN 4.4 06/27/2012 0919   AST 15 06/27/2012 0919   ALT 9 06/27/2012 0919   ALKPHOS 56 06/27/2012 0919   BILITOT 0.4 06/27/2012 0919   No results found for this basename: CHOL, HDL, LDLCALC, LDLDIRECT, TRIG, CHOLHDL   No results found for this basename: HGBA1C   No results found for this basename: VITAMINB12   Lab Results  Component Value Date   TSH 1.063 06/24/2013   I reviewed images myself and agree with interpretation.   06/30/13 CT head - normal   ASSESSMENT AND PLAN  23 y.o. year old female here with migraine headaches since age 61 years old, now with change in type and severity since past 3 months.  Contributing factors could include sleep deprivation and anxiety related to going through a divorce. Neurologic  exam and CT head are normal.  PLAN: - topiramate + sumatriptan prn for migraine control - start headache diary - stay hydrated - continue tapering off cigarette use - continue counseling for anxiety/stress - try melatonin for insomnia   No orders of the defined types were placed in this encounter.    Meds ordered this encounter  Medications  . Aspirin-Acetaminophen-Caffeine (EXCEDRIN MIGRAINE PO)    Sig: Take 1 tablet by mouth daily.  Marland Kitchen topiramate (TOPAMAX) 50 MG tablet    Sig: Take 1 tablet (50 mg total) by mouth 2 (two) times daily.    Dispense:  60 tablet    Refill:  12  . SUMAtriptan (IMITREX) 100 MG tablet    Sig: Take 1 tablet (100 mg total) by mouth once as needed for migraine. May repeat x 1 after 2 hours; maximum 2 tabs per day and 8 tabs per month    Dispense:  8 tablet    Refill:  6    Return in about 3 months (around 09/27/2013) for with Heide Guile or  [place].    Suanne Marker, MD 06/30/2013, 10:35 AM Certified in Neurology, Neurophysiology and Neuroimaging  [facility] [address] [place], [place] [address] [phone]

## 2013-06-30 NOTE — Patient Instructions (Signed)
Start topiramate 50mg  at bedtime for 2-4 weeks, to help prevent migraines. Then increase to 50mg  twice a day as tolerated.  Use sumatriptan 100mg  as needed at onset of migraine. May use in combination with ibuprofen.  Stay hydrated with water.  Monitor headaches in a calendar.  Try melatonin supplement (over the counter) as a sleep aid.

## 2013-07-03 ENCOUNTER — Telehealth: Payer: Self-pay | Admitting: Diagnostic Neuroimaging

## 2013-07-03 ENCOUNTER — Ambulatory Visit (INDEPENDENT_AMBULATORY_CARE_PROVIDER_SITE_OTHER): Payer: BC Managed Care – PPO | Admitting: Internal Medicine

## 2013-07-03 VITALS — BP 118/60 | HR 75 | Temp 98.1°F | Resp 16 | Ht 63.0 in | Wt 149.0 lb

## 2013-07-03 DIAGNOSIS — R220 Localized swelling, mass and lump, head: Secondary | ICD-10-CM

## 2013-07-03 DIAGNOSIS — T783XXA Angioneurotic edema, initial encounter: Secondary | ICD-10-CM

## 2013-07-03 DIAGNOSIS — R221 Localized swelling, mass and lump, neck: Secondary | ICD-10-CM

## 2013-07-03 DIAGNOSIS — T7840XA Allergy, unspecified, initial encounter: Secondary | ICD-10-CM

## 2013-07-03 MED ORDER — CETIRIZINE HCL 10 MG PO TABS: 10.0000 mg | ORAL_TABLET | Freq: Every day | ORAL | Status: DC

## 2013-07-03 MED ORDER — EPINEPHRINE 0.3 MG/0.3ML IJ SOAJ: 0.3000 mg | Freq: Once | INTRAMUSCULAR | Status: DC

## 2013-07-03 MED ORDER — PREDNISONE 10 MG PO TABS: ORAL_TABLET | ORAL | Status: DC

## 2013-07-03 MED ORDER — SODIUM CHLORIDE 0.9 % IV SOLN
125.0000 mg | Freq: Once | INTRAVENOUS | Status: AC
  Administered 2013-07-03: 125 mg via INTRAVENOUS

## 2013-07-03 NOTE — Progress Notes (Signed)
   Subjective:    Patient ID: Molly Gibbs, female    DOB: 1991/01/05, 23 y.o.   MRN: [id number]  HPI  Patient presents today with an allergic reaction to Imitrex. She took the medication at work about 11:30 am. And her tongue started swelling. She took 2 Benadryl on her way to the clinic. She stated that the swelling has started to go down. In no distress now. No sob, hives, wheezes, rash or other swelling.  Stat zantac 150mg  and zyrtec 10mg  po now.    Review of Systems asthma     Objective:   Physical Exam  Vitals reviewed. Constitutional: She is oriented to person, place, and time. She appears well-developed and well-nourished. No distress.  HENT:  Head: Normocephalic.  Right Ear: External ear normal.  Left Ear: External ear normal.  Nose: Nose normal.  Mouth/Throat: Oropharynx is clear and moist. Uvula swelling present.  Eyes: Conjunctivae and EOM are normal. Pupils are equal, round, and reactive to light. No scleral icterus.  Neck: Normal range of motion. Neck supple. Thyromegaly present.  Cardiovascular: Normal rate, regular rhythm and normal heart sounds.   Pulmonary/Chest: Effort normal and breath sounds normal.  Neurological: She is alert and oriented to person, place, and time. She has normal reflexes. No cranial nerve deficit. She exhibits normal muscle tone. Coordination normal.  Skin: No rash noted.  Psychiatric: She has a normal mood and affect. Her behavior is normal. Judgment and thought content normal.   Solumedrol 125mg  IV push given       Assessment & Plan:  10mg  Zyrtec 150mg  Zantac IV placed with 125mg  Solu-Medrol pushed through IV  For home--cetirizine 10mg , epipen,prednisone Follow up if recurs

## 2013-07-03 NOTE — Patient Instructions (Signed)
Anaphylactic Reaction An anaphylactic reaction is a sudden, severe allergic reaction that involves the whole body. It can be life threatening. A hospital stay is often required. People with asthma, eczema, or hay fever are slightly more likely to have an anaphylactic reaction. CAUSES  An anaphylactic reaction may be caused by anything to which you are allergic. After being exposed to the allergic substance, your immune system becomes sensitized to it. When you are exposed to that allergic substance again, an allergic reaction can occur. Common causes of an anaphylactic reaction include:  Medicines.  Foods, especially peanuts, wheat, shellfish, milk, and eggs.  Insect bites or stings.  Blood products.  Chemicals, such as dyes, latex, and contrast material used for imaging tests. SYMPTOMS  When an allergic reaction occurs, the body releases histamine and other substances. These substances cause symptoms such as tightening of the airway. Symptoms often develop within seconds or minutes of exposure. Symptoms may include:  Skin rash or hives.  Itching.  Chest tightness.  Swelling of the eyes, tongue, or lips.  Trouble breathing or swallowing.  Lightheadedness or fainting.  Anxiety or confusion.  Stomach pains, vomiting, or diarrhea.  Nasal congestion.  A fast or irregular heartbeat (palpitations). DIAGNOSIS  Diagnosis is based on your history of recent exposure to allergic substances, your symptoms, and a physical exam. Your caregiver may also perform blood or urine tests to confirm the diagnosis. TREATMENT  Epinephrine medicine is the main treatment for an anaphylactic reaction. Other medicines that may be used for treatment include antihistamines, steroids, and albuterol. In severe cases, fluids and medicine to support blood pressure may be given through an intravenous line (IV). Even if you improve after treatment, you need to be observed to make sure your condition does not get  worse. This may require a stay in the hospital. HOME CARE INSTRUCTIONS   Wear a medical alert bracelet or necklace stating your allergy.  You and your family must learn how to use an anaphylaxis kit or give an epinephrine injection to temporarily treat an emergency allergic reaction. Always carry your epinephrine injection or anaphylaxis kit with you. This can be lifesaving if you have a severe reaction.  Do not drive or perform tasks after treatment until the medicines used to treat your reaction have worn off, or until your caregiver says it is okay.  If you have hives or a rash:  Take medicines as directed by your caregiver.  You may use an over-the-counter antihistamine (diphenhydramine) as needed.  Apply cold compresses to the skin or take baths in cool water. Avoid hot baths or showers. SEEK MEDICAL CARE IF:   You develop symptoms of an allergic reaction to a new substance. Symptoms may start right away or minutes later.  You develop a rash, hives, or itching.  You develop new symptoms. SEEK IMMEDIATE MEDICAL CARE IF:   You have swelling of the mouth, difficulty breathing, or wheezing.  You have a tight feeling in your chest or throat.  You develop hives, swelling, or itching all over your body.  You develop severe vomiting or diarrhea.  You feel faint or pass out. This is an emergency. Use your epinephrine injection or anaphylaxis kit as you have been instructed. Call your local emergency services (911 in [place] Even if you improve after the injection, you need to be examined at a hospital emergency department. MAKE SURE YOU:   Understand these instructions.  Will watch your condition.  Will get help right away if you are not   doing well or get worse. Document Released: 05/14/2005 Document Revised: 11/13/2011 Document Reviewed: 08/15/2011 [facility] Patient Information 2014 [place], [place]. Angioedema Angioedema is a sudden swelling of tissues, often of the skin. It  can occur on the face or genitals or in the abdomen or other body parts. The swelling usually develops over a short period and gets better in 24 to 48 hours. It often begins during the night and is found when the person wakes up. The person may also get red, itchy patches of skin (hives). Angioedema can be dangerous if it involves swelling of the air passages.  Depending on the cause, episodes of angioedema may only happen once, come back in unpredictable patterns, or repeat for several years and then gradually fade away.  CAUSES  Angioedema can be caused by an allergic reaction to various triggers. It can also result from nonallergic causes, including reactions to drugs, immune system disorders, viral infections, or an abnormal gene that is passed to you from your parents (hereditary). For some people with angioedema, the cause is unknown.  Some things that can trigger angioedema include:   Foods.   Medicines, such as ACE inhibitors, ARBs, nonsteroidal anti-inflammatory agents, or estrogen.   Latex.   Animal saliva.   Insect stings.   Dyes used in X-rays.   Mild injury.   Dental work.  Surgery.  Stress.   Sudden changes in temperature.   Exercise. SIGNS AND SYMPTOMS   Swelling of the skin.  Hives. If these are present, there is also intense itching.  Redness in the affected area.   Pain in the affected area.  Swollen lips or tongue.  Breathing problems. This may happen if the air passages swell.  Wheezing. If internal organs are involved, there may be:   Nausea.   Abdominal pain.   Vomiting.   Difficulty swallowing.   Difficulty passing urine. DIAGNOSIS   Your health care provider will examine the affected area and take a medical and family history.  Various tests may be done to help determine the cause. Tests may include:  Allergy skin tests to see if the problem is an allergic reaction.   Blood tests to check for hereditary angioedema.    Tests to check for underlying diseases that could cause the condition.   A review of your medicines, including over the counter medicines, may be done. TREATMENT  Treatment will depend on the cause of the angioedema. Possible treatments include:   Removal of anything that triggered the condition (such as stopping certain medicines).   Medicines to treat symptoms or prevent attacks. Medicines given may include:   Antihistamines.   Epinephrine injection.   Steroids.   Hospitalization may be required for severe attacks. If the air passages are affected, it can be an emergency. Tubes may need to be placed to keep the airway open. HOME CARE INSTRUCTIONS   Only take over-the-counter or prescription medicines as directed by your health care provider.  If you were given medicines for emergency allergy treatment, always carry them with you.  Wear a medical bracelet as directed by your health care provider.   Avoid known triggers. SEEK MEDICAL CARE IF:   You have repeat attacks of angioedema.   Your attacks are more frequent or more severe despite preventive measures.   You have hereditary angioedema and are considering having children. It is important to discuss the risks of passing the condition on to your children with your health care provider. SEEK IMMEDIATE MEDICAL CARE IF:  You have severe swelling of the mouth, tongue, or lips.  You have difficulty breathing.   You have difficulty swallowing.   You faint. MAKE SURE YOU:  Understand these instructions.  Will watch your condition.  Will get help right away if you are not doing well or get worse. Document Released: 07/23/2001 Document Revised: 03/04/2013 Document Reviewed: 01/05/2013 [facility] Patient Information 2014 [place], [place].

## 2013-10-03 ENCOUNTER — Ambulatory Visit (INDEPENDENT_AMBULATORY_CARE_PROVIDER_SITE_OTHER): Payer: BC Managed Care – PPO | Admitting: Family Medicine

## 2013-10-03 VITALS — BP 106/70 | HR 94 | Temp 98.3°F | Resp 16 | Ht 63.5 in | Wt 152.4 lb

## 2013-10-03 DIAGNOSIS — N898 Other specified noninflammatory disorders of vagina: Secondary | ICD-10-CM

## 2013-10-03 DIAGNOSIS — R1032 Left lower quadrant pain: Secondary | ICD-10-CM

## 2013-10-03 DIAGNOSIS — N390 Urinary tract infection, site not specified: Secondary | ICD-10-CM

## 2013-10-03 DIAGNOSIS — N920 Excessive and frequent menstruation with regular cycle: Secondary | ICD-10-CM

## 2013-10-03 LAB — POCT URINALYSIS DIPSTICK
Bilirubin, UA: NEGATIVE
Blood, UA: NEGATIVE
Glucose, UA: NEGATIVE
Ketones, UA: NEGATIVE
Nitrite, UA: NEGATIVE
Protein, UA: NEGATIVE
Spec Grav, UA: 1.01
Urobilinogen, UA: 0.2
pH, UA: 7

## 2013-10-03 LAB — COMPREHENSIVE METABOLIC PANEL WITH GFR
ALT: 13 U/L (ref 0–35)
AST: 18 U/L (ref 0–37)
Albumin: 4.5 g/dL (ref 3.5–5.2)
Alkaline Phosphatase: 59 U/L (ref 39–117)
Potassium: 4.1 meq/L (ref 3.5–5.3)
Sodium: 138 meq/L (ref 135–145)
Total Protein: 7.1 g/dL (ref 6.0–8.3)

## 2013-10-03 LAB — COMPREHENSIVE METABOLIC PANEL
BUN: 11 mg/dL (ref 6–23)
CO2: 23 mEq/L (ref 19–32)
Calcium: 9.5 mg/dL (ref 8.4–10.5)
Chloride: 104 mEq/L (ref 96–112)
Creat: 0.59 mg/dL (ref 0.50–1.10)
Glucose, Bld: 89 mg/dL (ref 70–99)
Total Bilirubin: 0.7 mg/dL (ref 0.2–1.2)

## 2013-10-03 LAB — POCT UA - MICROSCOPIC ONLY
Casts, Ur, LPF, POC: NEGATIVE
Crystals, Ur, HPF, POC: NEGATIVE
Yeast, UA: NEGATIVE

## 2013-10-03 LAB — POCT WET PREP WITH KOH
KOH Prep POC: NEGATIVE
Trichomonas, UA: NEGATIVE
Yeast Wet Prep HPF POC: NEGATIVE

## 2013-10-03 LAB — POCT CBC
Granulocyte percent: 70.4 %G (ref 37–80)
HCT, POC: 38 % (ref 37.7–47.9)
Hemoglobin: 12.4 g/dL (ref 12.2–16.2)
Lymph, poc: 1.5 (ref 0.6–3.4)
MCH, POC: 31.2 pg (ref 27–31.2)
MCHC: 32.6 g/dL (ref 31.8–35.4)
MCV: 95.5 fL (ref 80–97)
MID (cbc): 0.3 (ref 0–0.9)
MPV: 13.4 fL (ref 0–99.8)
POC Granulocyte: 4.2 (ref 2–6.9)
POC LYMPH PERCENT: 24.5 %L (ref 10–50)
POC MID %: 5.1 % (ref 0–12)
Platelet Count, POC: 184 10*3/uL (ref 142–424)
RBC: 3.98 M/uL — AB (ref 4.04–5.48)
RDW, POC: 11.7 %
WBC: 6 10*3/uL (ref 4.6–10.2)

## 2013-10-03 LAB — POCT URINE PREGNANCY: Preg Test, Ur: NEGATIVE

## 2013-10-03 MED ORDER — CIPROFLOXACIN HCL 250 MG PO TABS: 250.0000 mg | ORAL_TABLET | Freq: Two times a day (BID) | ORAL | Status: DC

## 2013-10-03 NOTE — Patient Instructions (Signed)
Urinary Tract Infection  Urinary tract infections (UTIs) can develop anywhere along your urinary tract. Your urinary tract is your body's drainage system for removing wastes and extra water. Your urinary tract includes two kidneys, two ureters, a bladder, and a urethra. Your kidneys are a pair of bean-shaped organs. Each kidney is about the size of your fist. They are located below your ribs, one on each side of your spine.  CAUSES  Infections are caused by microbes, which are microscopic organisms, including fungi, viruses, and bacteria. These organisms are so small that they can only be seen through a microscope. Bacteria are the microbes that most commonly cause UTIs.  SYMPTOMS   Symptoms of UTIs may vary by age and gender of the patient and by the location of the infection. Symptoms in young women typically include a frequent and intense urge to urinate and a painful, burning feeling in the bladder or urethra during urination. Older women and men are more likely to be tired, shaky, and weak and have muscle aches and abdominal pain. A fever may mean the infection is in your kidneys. Other symptoms of a kidney infection include pain in your back or sides below the ribs, nausea, and vomiting.  DIAGNOSIS  To diagnose a UTI, your caregiver will ask you about your symptoms. Your caregiver also will ask to provide a urine sample. The urine sample will be tested for bacteria and white blood cells. White blood cells are made by your body to help fight infection.  TREATMENT   Typically, UTIs can be treated with medication. Because most UTIs are caused by a bacterial infection, they usually can be treated with the use of antibiotics. The choice of antibiotic and length of treatment depend on your symptoms and the type of bacteria causing your infection.  HOME CARE INSTRUCTIONS   If you were prescribed antibiotics, take them exactly as your caregiver instructs you. Finish the medication even if you feel better after you  have only taken some of the medication.   Drink enough water and fluids to keep your urine clear or pale yellow.   Avoid caffeine, tea, and carbonated beverages. They tend to irritate your bladder.   Empty your bladder often. Avoid holding urine for long periods of time.   Empty your bladder before and after sexual intercourse.   After a bowel movement, women should cleanse from front to back. Use each tissue only once.  SEEK MEDICAL CARE IF:    You have back pain.   You develop a fever.   Your symptoms do not begin to resolve within 3 days.  SEEK IMMEDIATE MEDICAL CARE IF:    You have severe back pain or lower abdominal pain.   You develop chills.   You have nausea or vomiting.   You have continued burning or discomfort with urination.  MAKE SURE YOU:    Understand these instructions.   Will watch your condition.   Will get help right away if you are not doing well or get worse.  Document Released: 02/21/2005 Document Revised: 11/13/2011 Document Reviewed: 06/22/2011  [employer] Patient Information 2014 [employer].

## 2013-10-03 NOTE — Progress Notes (Signed)
Chief Complaint:  Chief Complaint  Patient presents with  . Abdominal Pain    LLQ pain  with a +  home pregnancy test this am.  also had some spotting this am.      HPI: Molly Gibbs is a 23 y.o. female who is here for  Mirena, LLQ cramping, pregnancy test + this AM, spotted alittle bit , spotting a little bit no prior spoting  + chills, no fevers. Some back pain. Pain is intermittent feels terrible. No n/v. Ha s ahistory of ovarian cyst LMP has not had since APril 2014.  She was put on Mirena July after misscarried in June. LAst pap may have been last year and it was normal Abnormal pap in 2008, bx was fine  STD -none.    Past Medical History  Diagnosis Date  . Depression   . Asthma   . Anxiety   . Blood transfusion without reported diagnosis   . GERD (gastroesophageal reflux disease)    Past Surgical History  Procedure Laterality Date  . Cesarean section    . Dilation and evacuation N/A 10/29/2012    Procedure: DILATATION AND EVACUATION WITH ULTRASOUND;  Surgeon: Alphonsus SiasKelly A. Ernestina PennaFogleman, MD;  Location: [facility];  Service: Gynecology;  Laterality: N/A;  CHROMOSOME STUDIES;POSSIBLE MOLAR PREGNANCY.   Bladder instillation performed   History   Social History  . Marital Status: Single    Spouse Name: N/A    Number of Children: 1  . Years of Education: College   Occupational History  .      [employer]   Social History Main Topics  . Smoking status: Current Every Day Smoker -- 0.50 packs/day for 2 years    Types: Cigarettes  . Smokeless tobacco: Never Used  . Alcohol Use: Yes     Comment: occasionally 1 drink weekly  . Drug Use: No  . Sexual Activity: Yes    Birth Control/ Protection: Inserts   Other Topics Concern  . None   Social History Narrative   Patient lives at home with her daughter.   Caffeine Use: 2 sodas daily   Family History  Problem Relation Age of Onset  . Arthritis Maternal Grandmother     Rheumatoid arthritis  . Cancer Maternal  Grandmother     breast  . Arthritis Maternal Grandfather   . Arthritis Paternal Grandmother     Osteoarthritis  . Cancer Paternal Grandfather     lung  . Arthritis Paternal Grandfather     Osteoarthritis   Allergies  Allergen Reactions  . Codeine     swelling  . Imitrex [Sumatriptan] Swelling    tongue   Prior to Admission medications   Medication Sig Start Date End Date Taking? Authorizing Provider  Aspirin-Acetaminophen-Caffeine (EXCEDRIN MIGRAINE PO) Take 1 tablet by mouth daily.   Yes Historical Provider, MD  cetirizine (ZYRTEC) 10 MG tablet Take 1 tablet (10 mg total) by mouth daily. 07/03/13  Yes Jonita Albeehris W Guest, MD  EPINEPHrine (EPIPEN) 0.3 mg/0.3 mL SOAJ injection Inject 0.3 mLs (0.3 mg total) into the muscle once. 07/03/13  Yes Jonita Albeehris W Guest, MD  ibuprofen (ADVIL,MOTRIN) 200 MG tablet Take 400 mg by mouth every 6 (six) hours as needed for pain (for migraines).   Yes Historical Provider, MD     ROS: The patient denies fevers, chills, night sweats, unintentional weight loss, chest pain, palpitations, wheezing, dyspnea on exertion, nausea, vomiting, dysuria, hematuria, melena, numbness, weakness, or tingling.   All other systems have  been reviewed and were otherwise negative with the exception of those mentioned in the HPI and as above.    PHYSICAL EXAM: Filed Vitals:   10/03/13 1354  BP: 106/70  Pulse: 94  Temp: 98.3 F (36.8 C)  Resp: 16   Filed Vitals:   10/03/13 1354  Height: 5' 3.5" (1.613 m)  Weight: 152 lb 6.4 oz (69.128 kg)   Body mass index is 26.57 kg/(m^2).  General: Alert, no acute distress HEENT:  Normocephalic, atraumatic, oropharynx patent. EOMI, PERRLA Cardiovascular:  Regular rate and rhythm, no rubs murmurs or gallops.  No Carotid bruits, radial pulse intact. No pedal edema.  Respiratory: Clear to auscultation bilaterally.  No wheezes, rales, or rhonchi.  No cyanosis, no use of accessory musculature GI: No organomegaly, abdomen is soft and minimally  -tender LLQ, positive bowel sounds.  No masses. Skin: No rashes. Neurologic: Facial musculature symmetric. Psychiatric: Patient is appropriate throughout our interaction. Lymphatic: No cervical lymphadenopathy Musculoskeletal: Gait intact. Normal GU, no CMT, Mirena string visualized   LABS: Results for orders placed in visit on 10/03/13  POCT URINE PREGNANCY      Result Value Ref Range   Preg Test, Ur Negative    POCT UA - MICROSCOPIC ONLY      Result Value Ref Range   WBC, Ur, HPF, POC 3-5     RBC, urine, microscopic 0-2     Bacteria, U Microscopic 2+     Mucus, UA trace     Epithelial cells, urine per micros 8-12     Crystals, Ur, HPF, POC neg     Casts, Ur, LPF, POC neg     Yeast, UA neg    POCT URINALYSIS DIPSTICK      Result Value Ref Range   Color, UA light yellow     Clarity, UA cloudy     Glucose, UA neg     Bilirubin, UA neg     Ketones, UA neg     Spec Grav, UA 1.010     Blood, UA neg     pH, UA 7.0     Protein, UA neg     Urobilinogen, UA 0.2     Nitrite, UA neg     Leukocytes, UA small (1+)    POCT CBC      Result Value Ref Range   WBC 6.0  4.6 - 10.2 K/uL   Lymph, poc 1.5  0.6 - 3.4   POC LYMPH PERCENT 24.5  10 - 50 %L   MID (cbc) 0.3  0 - 0.9   POC MID % 5.1  0 - 12 %M   POC Granulocyte 4.2  2 - 6.9   Granulocyte percent 70.4  37 - 80 %G   RBC 3.98 (*) 4.04 - 5.48 M/uL   Hemoglobin 12.4  12.2 - 16.2 g/dL   HCT, POC 16.1  09.6 - 47.9 %   MCV 95.5  80 - 97 fL   MCH, POC 31.2  27 - 31.2 pg   MCHC 32.6  31.8 - 35.4 g/dL   RDW, POC 04.5     Platelet Count, POC 184  142 - 424 K/uL   MPV 13.4  0 - 99.8 fL  POCT WET PREP WITH KOH      Result Value Ref Range   Trichomonas, UA Negative     Clue Cells Wet Prep HPF POC 2-3     Epithelial Wet Prep HPF POC 3-6     Yeast Wet Prep HPF  POC neg     Bacteria Wet Prep HPF POC 2+     RBC Wet Prep HPF POC 0-1     WBC Wet Prep HPF POC 8-12     KOH Prep POC Negative       EKG/XRAY:   Primary read  interpreted [facility] Con[facility]Le [facility].   ASSESSMENT/PLAN: Encounter Diagnoses  Name Primary?  . Spotting Yes  . LLQ abdominal pain   . UTI (urinary tract infection)    Normal labs except small leuks, txt with cipro 250 mg BID x 3 days ? UTI vs ovarian cyst Sh eis not pregnant , mirena string visualized on pelvic exam Will cx urine F/u prn  Gross sideeffects, risk and benefits, and alternatives of medications d/w patient. Patient is aware that all medications have potential sideeffects and we are unable to predict every sideeffect or drug-drug interaction that may occur.  Lenell Antuhao P Stephone Gum, DO 10/03/2013 3:49 PM

## 2013-10-05 LAB — GC/CHLAMYDIA PROBE AMP
CT Probe RNA: NEGATIVE
GC Probe RNA: NEGATIVE

## 2013-10-05 LAB — URINE CULTURE
Colony Count: NO GROWTH
Organism ID, Bacteria: NO GROWTH

## 2013-10-13 ENCOUNTER — Ambulatory Visit: Payer: BC Managed Care – PPO | Admitting: Diagnostic Neuroimaging

## 2013-11-10 ENCOUNTER — Ambulatory Visit: Payer: BC Managed Care – PPO | Admitting: Diagnostic Neuroimaging

## 2014-03-24 ENCOUNTER — Ambulatory Visit (INDEPENDENT_AMBULATORY_CARE_PROVIDER_SITE_OTHER): Payer: 59 | Admitting: Family Medicine

## 2014-03-24 VITALS — BP 100/60 | HR 87 | Temp 98.7°F | Resp 16 | Ht 64.0 in | Wt 152.0 lb

## 2014-03-24 DIAGNOSIS — F329 Major depressive disorder, single episode, unspecified: Secondary | ICD-10-CM

## 2014-03-24 DIAGNOSIS — F32A Depression, unspecified: Secondary | ICD-10-CM

## 2014-03-24 DIAGNOSIS — F410 Panic disorder [episodic paroxysmal anxiety] without agoraphobia: Secondary | ICD-10-CM

## 2014-03-24 MED ORDER — CITALOPRAM HYDROBROMIDE 20 MG PO TABS: 20.0000 mg | ORAL_TABLET | Freq: Every day | ORAL | Status: DC

## 2014-03-24 NOTE — Patient Instructions (Addendum)
Psychiatry:  Molly LuisJane Gibbs, Molly Gibbs [phone]   Please return in 2 weeks for follow up to make sure medicine is working and things are better.  You need to call if situation is worsening and impossible to deal with.

## 2014-03-24 NOTE — Progress Notes (Signed)
   Subjective:    Patient ID: Molly Gibbs, female    DOB: November 05, 1990, 23 y.o.   MRN: [id number] This chart was scribed for Elvina SidleKurt Noris Kulinski, MD by Jolene Provostobert Halas, Medical Scribe. This patient was seen in Room 12 and the patient's care was started at 12:20 PM.  HPI HPI Comments: Molly Gibbs is a 23 y.o. female who [facility] to [facility] complaining of anxiety and depression that became acute today at work. Pt states that she may have had a panic attack today. Pt states she has not been feeling well emotionally for some time. Pt states that her issues all stem from issues at work. Pt states that she is going through a divorce, and she just finished mediation with her daughter. Pt states she was on depression medication when she was younger. Pt endorses sleep disturbance, and difficulty eating. Pt states she drinks a lot of Dr Reino KentPepper. Pt states that she is thinking about hurting herself. Pt has a plan to hurt herself. Pt would like a medication to manage her anxiety.  Pt states that she would be willing to call someone, but she does not know where to start. Pt states she would like to be referred to a phycological practice.    Review of Systems  Psychiatric/Behavioral: Positive for suicidal ideas, sleep disturbance and agitation. The patient is nervous/anxious.   All other systems reviewed and are negative.      Objective:   Physical Exam  Nursing note and vitals reviewed. Constitutional: She is oriented to person, place, and time. She appears well-developed and well-nourished.  HENT:  Head: Normocephalic and atraumatic.  Eyes: Pupils are equal, round, and reactive to light.  Neck: No JVD present. No thyromegaly present.  Cardiovascular: Normal rate, regular rhythm and normal heart sounds.   Pulmonary/Chest: Effort normal and breath sounds normal. No respiratory distress.  Lymphadenopathy:    She has no cervical adenopathy.  Neurological: She is alert and oriented to person, place, and time.    Skin: Skin is warm and dry.  Psychiatric: Her mood appears anxious. She expresses suicidal ideation. She expresses suicidal plans.  Pt states that she has thought about suicide, and if she did she would use melatonin. Pt states she promises to call phycological practice.       Assessment & Plan:    Panic anxiety syndrome - Plan: citalopram (CELEXA) 20 MG tablet, Ambulatory referral to Psychiatry  Depression - Plan: citalopram (CELEXA) 20 MG tablet, Ambulatory referral to Psychiatry  Signed, Elvina SidleKurt Yassmine Tamm, MD

## 2014-04-09 ENCOUNTER — Encounter: Payer: Self-pay | Admitting: Family Medicine

## 2014-04-09 ENCOUNTER — Ambulatory Visit (INDEPENDENT_AMBULATORY_CARE_PROVIDER_SITE_OTHER): Payer: 59 | Admitting: Family Medicine

## 2014-04-09 VITALS — BP 88/60 | HR 79 | Temp 98.5°F | Resp 16 | Ht 63.5 in | Wt 151.6 lb

## 2014-04-09 DIAGNOSIS — F329 Major depressive disorder, single episode, unspecified: Secondary | ICD-10-CM

## 2014-04-09 DIAGNOSIS — F32A Depression, unspecified: Secondary | ICD-10-CM

## 2014-04-09 DIAGNOSIS — F411 Generalized anxiety disorder: Secondary | ICD-10-CM

## 2014-04-09 MED ORDER — HYDROXYZINE HCL 10 MG PO TABS: 10.0000 mg | ORAL_TABLET | Freq: Three times a day (TID) | ORAL | Status: DC | PRN

## 2014-04-09 NOTE — Progress Notes (Signed)
Chief Complaint:  Chief Complaint  Patient presents with  . Follow-up    PANIC ATTACK and ANXIETY    HPI: Molly Gibbs is a 23 y.o. female who is here for  Recheck after being on celexa from the last 2-3 weeks for anxiety  She has been on celexa, she felt great the 1st week, after the first week and felt she could have a panic attack at any time like when she first came in. She had one pn Wednesday, she was working when this happened. Works in [employer] and there is one person at work who triggers her anxiety. Her mom and aunt takes xanax, but she does not want it.   She states she is doing well otherwise, good po intake, good sleep hygieine but odes feel tired, during a work week will get 4-5 hrs per night of sleep, on the weekends she sleeps 10-12 hours.  She goes to bed at 9 pm, she wakes up at 6  Most nights so there is no issues with mania or bipolar    Paternal GM has anxiety and and deptression, suicidal attempts. Priro hx of suicidalitlty attempt, age 23, she ahs never attempted again She lost her father to a car accident which triggered it  [facility], has appt with Dr Ann MakiParrish in January but has to pay out of pocket for this, she is waiting for her company to call her back with an appt with [facility], which is contracted with her company so she does not have to pay for her psych visits.    No SI/HI/Hallucination/mania. Please see OV below, she is stressed and undergoing custody issues for her duaghter.   Molly Gibbs is a 23 y.o. female who [facility] complaining of anxiety and depression that became acute today at work. Pt states that she may have had a panic attack today. Pt states she has not been feeling well emotionally for some time. Pt states that her issues all stem from issues at work. Pt states that she is going through a divorce, and she just finished mediation with her daughter. Pt states she was on depression medication when she was younger. Pt  endorses sleep disturbance, and difficulty eating. Pt states she drinks a lot of Dr Reino KentPepper. Pt states that she is thinking about hurting herself. Pt has a plan to hurt herself. Pt would like a medication to manage her anxiety.  Pt states that she would be willing to call someone, but she does not know where to start. Pt states she would like to be referred to a phycological practice.    Past Medical History  Diagnosis Date  . Depression   . Asthma   . Anxiety   . Blood transfusion without reported diagnosis   . GERD (gastroesophageal reflux disease)    Past Surgical History  Procedure Laterality Date  . Cesarean section    . Dilation and evacuation N/A 10/29/2012    Procedure: DILATATION AND EVACUATION WITH ULTRASOUND;  Surgeon: Alphonsus SiasKelly A. Ernestina PennaFogleman, MD;  Location: [facility];  Service: Gynecology;  Laterality: N/A;  CHROMOSOME STUDIES;POSSIBLE MOLAR PREGNANCY.   Bladder instillation performed   History   Social History  . Marital Status: Single    Spouse Name: N/A    Number of Children: 1  . Years of Education: College   Occupational History  .      [employer]   Social History Main Topics  . Smoking status: Current Every Day Smoker --  0.50 packs/day for 2 years    Types: Cigarettes  . Smokeless tobacco: Never Used  . Alcohol Use: Yes     Comment: occasionally 1 drink weekly  . Drug Use: No  . Sexual Activity: Yes    Birth Control/ Protection: Inserts   Other Topics Concern  . None   Social History Narrative   Patient lives at home with her daughter.   Caffeine Use: 2 sodas daily   Family History  Problem Relation Age of Onset  . Arthritis Maternal Grandmother     Rheumatoid arthritis  . Cancer Maternal Grandmother     breast  . Arthritis Maternal Grandfather   . Arthritis Paternal Grandmother     Osteoarthritis  . Cancer Paternal Grandfather     lung  . Arthritis Paternal Grandfather     Osteoarthritis   Allergies  Allergen Reactions  . Codeine      swelling  . Imitrex [Sumatriptan] Swelling    tongue   Prior to Admission medications   Medication Sig Start Date End Date Taking? Authorizing Provider  Aspirin-Acetaminophen-Caffeine (EXCEDRIN MIGRAINE PO) Take 1 tablet by mouth daily.   Yes Historical Provider, MD  citalopram (CELEXA) 20 MG tablet Take 1 tablet (20 mg total) by mouth daily. 03/24/14  Yes Elvina Sidle, MD  EPINEPHrine (EPIPEN) 0.3 mg/0.3 mL SOAJ injection Inject 0.3 mLs (0.3 mg total) into the muscle once. 07/03/13  Yes Jonita Albee, MD  ibuprofen (ADVIL,MOTRIN) 200 MG tablet Take 400 mg by mouth every 6 (six) hours as needed for pain (for migraines).   Yes Historical Provider, MD     ROS: The patient denies fevers, chills, night sweats, unintentional weight loss, chest pain, palpitations, wheezing, dyspnea on exertion, nausea, vomiting, abdominal pain, dysuria, hematuria, melena, numbness, weakness, or tingling.   All other systems have been reviewed and were otherwise negative with the exception of those mentioned in the HPI and as above.    PHYSICAL EXAM: Filed Vitals:   04/09/14 0818  BP: 88/60  Pulse: 79  Temp: 98.5 F (36.9 C)  Resp: 16   Filed Vitals:   04/09/14 0818  Height: 5' 3.5" (1.613 m)  Weight: 151 lb 9.6 oz (68.765 kg)   Body mass index is 26.43 kg/(m^2).  General: Alert, no acute distress HEENT:  Normocephalic, atraumatic, oropharynx patent. EOMI, PERRLA Cardiovascular:  Regular rate and rhythm, no rubs murmurs or gallops.  No Carotid bruits, radial pulse intact. No pedal edema.  Respiratory: Clear to auscultation bilaterally.  No wheezes, rales, or rhonchi.  No cyanosis, no use of accessory musculature GI: No organomegaly, abdomen is soft and non-tender, positive bowel sounds.  No masses. Skin: No rashes. Neurologic: Facial musculature symmetric. Psychiatric: Patient is appropriate throughout our interaction. Lymphatic: No cervical lymphadenopathy Musculoskeletal: Gait  intact.   LABS: Results for orders placed or performed in visit on 10/03/13  GC/Chlamydia Probe Amp  Result Value Ref Range   CT Probe RNA NEGATIVE    GC Probe RNA NEGATIVE   Urine culture  Result Value Ref Range   Colony Count NO GROWTH    Organism ID, Bacteria NO GROWTH   Comprehensive metabolic panel  Result Value Ref Range   Sodium 138 135 - 145 mEq/L   Potassium 4.1 3.5 - 5.3 mEq/L   Chloride 104 96 - 112 mEq/L   CO2 23 19 - 32 mEq/L   Glucose, Bld 89 70 - 99 mg/dL   BUN 11 6 - 23 mg/dL   Creat 1.61 0.96 -  1.10 mg/dL   Total Bilirubin 0.7 0.2 - 1.2 mg/dL   Alkaline Phosphatase 59 39 - 117 U/L   AST 18 0 - 37 U/L   ALT 13 0 - 35 U/L   Total Protein 7.1 6.0 - 8.3 g/dL   Albumin 4.5 3.5 - 5.2 g/dL   Calcium 9.5 8.4 - 16.110.5 mg/dL  POCT urine pregnancy  Result Value Ref Range   Preg Test, Ur Negative   POCT UA - Microscopic Only  Result Value Ref Range   WBC, Ur, HPF, POC 3-5    RBC, urine, microscopic 0-2    Bacteria, U Microscopic 2+    Mucus, UA trace    Epithelial cells, urine per micros 8-12    Crystals, Ur, HPF, POC neg    Casts, Ur, LPF, POC neg    Yeast, UA neg   POCT urinalysis dipstick  Result Value Ref Range   Color, UA light yellow    Clarity, UA cloudy    Glucose, UA neg    Bilirubin, UA neg    Ketones, UA neg    Spec Grav, UA 1.010    Blood, UA neg    pH, UA 7.0    Protein, UA neg    Urobilinogen, UA 0.2    Nitrite, UA neg    Leukocytes, UA small (1+)   POCT CBC  Result Value Ref Range   WBC 6.0 4.6 - 10.2 K/uL   Lymph, poc 1.5 0.6 - 3.4   POC LYMPH PERCENT 24.5 10 - 50 %L   MID (cbc) 0.3 0 - 0.9   POC MID % 5.1 0 - 12 %M   POC Granulocyte 4.2 2 - 6.9   Granulocyte percent 70.4 37 - 80 %G   RBC 3.98 (A) 4.04 - 5.48 M/uL   Hemoglobin 12.4 12.2 - 16.2 g/dL   HCT, POC 09.638.0 04.537.7 - 47.9 %   MCV 95.5 80 - 97 fL   MCH, POC 31.2 27 - 31.2 pg   MCHC 32.6 31.8 - 35.4 g/dL   RDW, POC 40.911.7 %   Platelet Count, POC 184 142 - 424 K/uL   MPV 13.4  0 - 99.8 fL  POCT Wet Prep with KOH  Result Value Ref Range   Trichomonas, UA Negative    Clue Cells Wet Prep HPF POC 2-3    Epithelial Wet Prep HPF POC 3-6    Yeast Wet Prep HPF POC neg    Bacteria Wet Prep HPF POC 2+    RBC Wet Prep HPF POC 0-1    WBC Wet Prep HPF POC 8-12    KOH Prep POC Negative      EKG/XRAY:   Primary read interpreted by Dr[facility] Conley [facility]Le at [facility].   ASSESSMENT/PLAN: Encounter Diagnoses  Name Primary?  . Depression   . Generalized anxiety disorder Yes   C/w celexea Currently no SI.HI/hallucinations/mania Rx vistaril F/u with psych when she gets an appt. I would like her to get her pscyh meds from them if possible  Gross sideeffects, risk and benefits, and alternatives of medications d/w patient. Patient is aware that all medications have potential sideeffects and we are unable to predict every sideeffect or drug-drug interaction that may occur.  Novis League PHUONG, DO 04/09/2014 8:59 AM

## 2014-04-09 NOTE — Patient Instructions (Signed)
Depression Depression refers to feeling sad, low, down in the dumps, blue, gloomy, or empty. In general, there are two kinds of depression: 1. Normal sadness or normal grief. This kind of depression is one that we all feel from time to time after upsetting life experiences, such as the loss of a job or the ending of a relationship. This kind of depression is considered normal, is short lived, and resolves within a few days to 2 weeks. Depression experienced after the loss of a loved one (bereavement) often lasts longer than 2 weeks but normally gets better with time. 2. Clinical depression. This kind of depression lasts longer than normal sadness or normal grief or interferes with your ability to function at home, at work, and in school. It also interferes with your personal relationships. It affects almost every aspect of your life. Clinical depression is an illness. Symptoms of depression can also be caused by conditions other than those mentioned above, such as:  Physical illness. Some physical illnesses, including underactive thyroid gland (hypothyroidism), severe anemia, specific types of cancer, diabetes, uncontrolled seizures, heart and lung problems, strokes, and chronic pain are commonly associated with symptoms of depression.  Side effects of some prescription medicine. In some people, certain types of medicine can cause symptoms of depression.  Substance abuse. Abuse of alcohol and illicit drugs can cause symptoms of depression. SYMPTOMS Symptoms of normal sadness and normal grief include the following:  Feeling sad or crying for short periods of time.  Not caring about anything (apathy).  Difficulty sleeping or sleeping too much.  No longer able to enjoy the things you used to enjoy.  Desire to be by oneself all the time (social isolation).  Lack of energy or motivation.  Difficulty concentrating or remembering.  Change in appetite or weight.  Restlessness or  agitation. Symptoms of clinical depression include the same symptoms of normal sadness or normal grief and also the following symptoms:  Feeling sad or crying all the time.  Feelings of guilt or worthlessness.  Feelings of hopelessness or helplessness.  Thoughts of suicide or the desire to harm yourself (suicidal ideation).  Loss of touch with reality (psychotic symptoms). Seeing or hearing things that are not real (hallucinations) or having false beliefs about your life or the people around you (delusions and paranoia). DIAGNOSIS  The diagnosis of clinical depression is usually based on how bad the symptoms are and how long they have lasted. Your health care provider will also ask you questions about your medical history and substance use to find out if physical illness, use of prescription medicine, or substance abuse is causing your depression. Your health care provider may also order blood tests. TREATMENT  Often, normal sadness and normal grief do not require treatment. However, sometimes antidepressant medicine is given for bereavement to ease the depressive symptoms until they resolve. The treatment for clinical depression depends on how bad the symptoms are but often includes antidepressant medicine, counseling with a mental health professional, or both. Your health care provider will help to determine what treatment is best for you. Depression caused by physical illness usually goes away with appropriate medical treatment of the illness. If prescription medicine is causing depression, talk with your health care provider about stopping the medicine, decreasing the dose, or changing to another medicine. Depression caused by the abuse of alcohol or illicit drugs goes away when you stop using these substances. Some adults need professional help in order to stop drinking or using drugs. SEEK IMMEDIATE MEDICAL   CARE IF:  You have thoughts about hurting yourself or others.  You lose touch  with reality (have psychotic symptoms).  You are taking medicine for depression and have a serious side effect. FOR MORE INFORMATION  National Alliance on Mental Illness: www.nami.org  National Institute of Mental Health: www.nimh.nih.gov Document Released: 05/11/2000 Document Revised: 09/28/2013 Document Reviewed: 08/13/2011 ExitCare Patient Information 2015 [employer]. This information is not intended to replace advice given to you by your health care provider. Make sure you discuss any questions you have with your health care provider. Generalized Anxiety Disorder Generalized anxiety disorder (GAD) is a mental disorder. It interferes with life functions, including relationships, work, and school. GAD is different from normal anxiety, which everyone experiences at some point in their lives in response to specific life events and activities. Normal anxiety actually helps us prepare for and get through these life events and activities. Normal anxiety goes away after the event or activity is over.  GAD causes anxiety that is not necessarily related to specific events or activities. It also causes excess anxiety in proportion to specific events or activities. The anxiety associated with GAD is also difficult to control. GAD can vary from mild to severe. People with severe GAD can have intense waves of anxiety with physical symptoms (panic attacks).  SYMPTOMS The anxiety and worry associated with GAD are difficult to control. This anxiety and worry are related to many life events and activities and also occur more days than not for 6 months or longer. People with GAD also have three or more of the following symptoms (one or more in children): 3. Restlessness.  4. Fatigue. 5. Difficulty concentrating.  6. Irritability. 7. Muscle tension. 8. Difficulty sleeping or unsatisfying sleep. DIAGNOSIS GAD is diagnosed through an assessment by your health care provider. Your health care provider  will ask you questions aboutyour mood,physical symptoms, and events in your life. Your health care provider may ask you about your medical history and use of alcohol or drugs, including prescription medicines. Your health care provider may also do a physical exam and blood tests. Certain medical conditions and the use of certain substances can cause symptoms similar to those associated with GAD. Your health care provider may refer you to a mental health specialist for further evaluation. TREATMENT The following therapies are usually used to treat GAD:   Medication. Antidepressant medication usually is prescribed for long-term daily control. Antianxiety medicines may be added in severe cases, especially when panic attacks occur.   Talk therapy (psychotherapy). Certain types of talk therapy can be helpful in treating GAD by providing support, education, and guidance. A form of talk therapy called cognitive behavioral therapy can teach you healthy ways to think about and react to daily life events and activities.  Stress managementtechniques. These include yoga, meditation, and exercise and can be very helpful when they are practiced regularly. A mental health specialist can help determine which treatment is best for you. Some people see improvement with one therapy. However, other people require a combination of therapies. Document Released: 09/08/2012 Document Revised: 09/28/2013 Document Reviewed: 09/08/2012 ExitCare Patient Information 2015 Exit[employer]. This information is not intended to replace advice given to you by your health care provider. Make sure you discuss any questions you have with your health care provider.  

## 2014-07-16 ENCOUNTER — Ambulatory Visit (INDEPENDENT_AMBULATORY_CARE_PROVIDER_SITE_OTHER): Payer: 59 | Admitting: Physician Assistant

## 2014-07-16 ENCOUNTER — Encounter: Payer: Self-pay | Admitting: Family Medicine

## 2014-07-16 VITALS — BP 106/69 | HR 76 | Temp 98.5°F | Resp 16 | Ht 63.5 in | Wt 155.0 lb

## 2014-07-16 DIAGNOSIS — M654 Radial styloid tenosynovitis [de Quervain]: Secondary | ICD-10-CM

## 2014-07-16 DIAGNOSIS — G5602 Carpal tunnel syndrome, left upper limb: Secondary | ICD-10-CM

## 2014-07-16 MED ORDER — PREDNISONE 20 MG PO TABS: ORAL_TABLET | ORAL | Status: DC

## 2014-07-16 NOTE — Patient Instructions (Signed)
Carpal Tunnel Syndrome The carpal tunnel is a narrow area located on the palm side of your wrist. The tunnel is formed by the wrist bones and ligaments. Nerves, blood vessels, and tendons pass through the carpal tunnel. Repeated wrist motion or certain diseases may cause swelling within the tunnel. This swelling pinches the main nerve in the wrist (median nerve) and causes the painful hand and arm condition called carpal tunnel syndrome. CAUSES   Repeated wrist motions.  Wrist injuries.  Certain diseases like arthritis, diabetes, alcoholism, hyperthyroidism, and kidney failure.  Obesity.  Pregnancy. SYMPTOMS   A "pins and needles" feeling in your fingers or hand, especially in your thumb, index and middle fingers.  Tingling or numbness in your fingers or hand.  An aching feeling in your entire arm, especially when your wrist and elbow are bent for long periods of time.  Wrist pain that goes up your arm to your shoulder.  Pain that goes down into your palm or fingers.  A weak feeling in your hands. DIAGNOSIS  Your health care provider will take your history and perform a physical exam. An electromyography test may be needed. This test measures electrical signals sent out by your nerves into the muscles. The electrical signals are usually slowed by carpal tunnel syndrome. You may also need X-rays. TREATMENT  Carpal tunnel syndrome may clear up by itself. Your health care provider may recommend a wrist splint or medicine such as a nonsteroidal anti-inflammatory medicine. Cortisone injections may help. Sometimes, surgery may be needed to free the pinched nerve.  HOME CARE INSTRUCTIONS   Take all medicine as directed by your health care provider. Only take over-the-counter or prescription medicines for pain, discomfort, or fever as directed by your health care provider.  If you were given a splint to keep your wrist from bending, wear it as directed. It is important to wear the splint at  night. Wear the splint for as long as you have pain or numbness in your hand, arm, or wrist. This may take 1 to 2 months.  Rest your wrist from any activity that may be causing your pain. If your symptoms are work-related, you may need to talk to your employer about changing to a job that does not require using your wrist.  Put ice on your wrist after long periods of wrist activity.  Put ice in a plastic bag.  Place a towel between your skin and the bag.  Leave the ice on for 15-20 minutes, 03-04 times a day.  Keep all follow-up visits as directed by your health care provider. This includes any orthopedic referrals, physical therapy, and rehabilitation. Any delay in getting necessary care could result in a delay or failure of your condition to heal. SEEK IMMEDIATE MEDICAL CARE IF:   You have new, unexplained symptoms.  Your symptoms get worse and are not helped or controlled with medicines. MAKE SURE YOU:   Understand these instructions.  Will watch your condition.  Will get help right away if you are not doing well or get worse. Document Released: 05/11/2000 Document Revised: 09/28/2013 Document Reviewed: 03/30/2011 [facility] Patient Information 2015 [place], [place]. This information is not intended to replace advice given to you by your health care provider. Make sure you discuss any questions you have with your health care provider. De Quervain's Disease Tommi Rumps Quervain's disease is a condition often seen in racquet sports where there is a soreness (inflammation) in the cord like structures (tendons) which attach muscle to bone on the  thumb side of the wrist. There may be a tightening of the tissuesaround the tendons. This condition is often helped by giving up or modifying the activity which caused it. When conservative treatment does not help, surgery may be required. Conservative treatment could include changes in the activity which brought about the problem or made it worse.  Anti-inflammatory medications and injections may be used to help decrease the inflammation and help with pain control. Your caregiver will help you determine which is best for you. DIAGNOSIS  Often the diagnosis (learning what is wrong) can be made by examination. Sometimes x-rays are required. HOME CARE INSTRUCTIONS   Apply ice to the sore area for 15-20 minutes, 03-04 times per day while awake. Put the ice in a plastic bag and place a towel between the bag of ice and your skin. This is especially helpful if it can be done after all activities involving the sore wrist.  Temporary splinting may help.  Only take over-the-counter or prescription medicines for pain, discomfort or fever as directed by your caregiver. SEEK MEDICAL CARE IF:   Pain relief is not obtained with medications, or if you have increasing pain and seem to be getting worse rather than better. MAKE SURE YOU:   Understand these instructions.  Will watch your condition.  Will get help right away if you are not doing well or get worse. Document Released: 02/06/2001 Document Revised: 08/06/2011 Document Reviewed: 09/16/2013 [facility] Patient Information 2015 [place]. This information is not intended to replace advice given to you by your health care provider. Make sure you discuss any questions you have with your health care provider.

## 2014-07-16 NOTE — Progress Notes (Signed)
07/16/2014 at 12:07 PM  Molly Gibbs / DOB: Mar 01, 1991 / MRN: [id number]  The patient has Asthma and Knee pain on her problem list.  SUBJECTIVE  Chief compalaint: Wrist Pain   History of present illness: Molly Gibbs is 24 y.o. well appearing right handed female presenting for left wrist pain times 3 weeks.  She denies any inciting incident, trauma, and denies any new physical activities.  She does not report swelling or bony tenderness, and feels the problem is in her muscles.  She does report pain with ulnar deviation of the left wrist, and also reports numbness of the thumb, index and third digit that is made worse with typing.  She has tried OTC aleve and ibuprofen at there maximum OTC doses without relief.  She has a mirena IUD in place since 2013.     She  has a past medical history of Depression; Asthma; Anxiety; Blood transfusion without reported diagnosis; and GERD (gastroesophageal reflux disease).    She has a current medication list which includes the following prescription(s): aspirin-acetaminophen-caffeine, citalopram, epinephrine, ibuprofen, hydroxyzine, and prednisone.  Molly Gibbs is allergic to codeine and imitrex. She  reports that she has been smoking Cigarettes.  She has a 1 pack-year smoking history. She has never used smokeless tobacco. She reports that she drinks alcohol. She reports that she does not use illicit drugs. She  reports that she currently engages in sexual activity. She reports using the following method of birth control/protection: Inserts. The patient  has past surgical history that includes Cesarean section and Dilation and evacuation (N/A, 10/29/2012).  Her family history includes Arthritis in her maternal grandfather, maternal grandmother, paternal grandfather, and paternal grandmother; Cancer in her maternal grandmother and paternal grandfather.  Review of Systems  Constitutional: Negative for fever, chills, weight loss, malaise/fatigue and diaphoresis.    Musculoskeletal: Positive for myalgias and joint pain. Negative for back pain, falls and neck pain.  Neurological: Negative for weakness.    OBJECTIVE  Her  height is 5' 3.5" (1.613 m) and weight is 155 lb (70.308 kg). Her oral temperature is 98.5 F (36.9 C). Her blood pressure is 106/69 and her pulse is 76. Her respiration is 16 and oxygen saturation is 99%.  The patient's body mass index is 27.02 kg/(m^2).  Physical Exam  Cardiovascular: Normal rate.   Pulses:      Radial pulses are 2+ on the right side, and 2+ on the left side.  Musculoskeletal:       Right wrist: Normal.       Left wrist: She exhibits decreased range of motion and tenderness. She exhibits no bony tenderness, no swelling, no effusion, no crepitus, no deformity and no laceration.       Arms:   No results found for this or any previous visit (from the past 24 hour(s)).  ASSESSMENT & PLAN  Molly Gibbs was seen today for wrist pain.  Diagnoses and all orders for this visit:  De Quervain's tenosynovitis: Patient with positive Finklesteins.   Orders: -     predniSONE (DELTASONE) 20 MG tablet; Take 3 PO QAM x3days, 2 PO QAM x3days, 1 PO QAM x3days -     Removal thumb spica splint placed.  To be followed up in three weeks.  Patient instructed to remove only for hand washing and bathing.   -     Patient will likely need an ortho referral should this plan fail.   -     She will report back to 102 in  3 weeks for reeval.   Carpal tunnel syndrome of left wrist Orders: -     predniSONE (DELTASONE) 20 MG tablet; Take 3 PO QAM x3days, 2 PO QAM x3days, 1 PO QAM x3days   The patient was advised to call or come back to clinic if she does not see an improvement in symptoms, or worsens with the above plan.   Deliah BostonMichael Kaevon Cotta, MHS, PA-C [facility] 07/16/2014 12:07 PM

## 2014-07-20 ENCOUNTER — Other Ambulatory Visit: Payer: Self-pay | Admitting: Physician Assistant

## 2014-07-20 ENCOUNTER — Telehealth: Payer: Self-pay | Admitting: Physician Assistant

## 2014-07-20 NOTE — Telephone Encounter (Signed)
De Quervain's tenosynovitis: Patient with positive Finklesteins.  Orders: - predniSONE (DELTASONE) 20 MG tablet; Take 3 PO QAM x3days, 2 PO QAM x3days, 1 PO QAM x3days - Removal thumb spica splint placed. To be followed up in three weeks. Patient instructed to remove only for hand washing and bathing.  - Patient will likely need an ortho referral should this plan fail.  - She will report back to 102 in 3 weeks for reeval.   Can you help with this note Casimiro NeedleMichael?

## 2014-07-20 NOTE — Telephone Encounter (Signed)
Hi Molly Gibbs,  Please compose a note for this patient.  If she does not rest the joint for three weeks then we will have to refer her to ortho and [facility]  Her only restriction is that she must wear the brace for three weeks.  Thank you for your help.    Deliah BostonMichael Johnnette Laux, MS, PA-C   2:59 PM, 07/20/2014

## 2014-07-20 NOTE — Telephone Encounter (Signed)
Patient states that she was seen on 07/16/2014 for carpal tunnel. She was given a brace to wear for about 3 weeks. She states that her employer is asking her to take her brace off and do tasks that put more strain on her arm. Can she have a note to give to her HR department explaining in detail why she has to wear the brace, any restrictions she has, and for how long she needs to wear it?  [phone]

## 2014-07-21 NOTE — Telephone Encounter (Signed)
Note upstairs on my desk. Called pt to let her know and to ask if she needed me to send it to her employer.

## 2014-07-22 NOTE — Telephone Encounter (Signed)
Letter faxed to her employer

## 2014-07-22 NOTE — Telephone Encounter (Signed)
Molly Gibbs   Please fax note to [phone]

## 2014-09-24 ENCOUNTER — Emergency Department (HOSPITAL_BASED_OUTPATIENT_CLINIC_OR_DEPARTMENT_OTHER)
Admission: EM | Admit: 2014-09-24 | Discharge: 2014-09-24 | Disposition: A | Discharge status: Home / Self Care | Payer: Self-pay | Attending: Emergency Medicine | Admitting: Emergency Medicine

## 2014-09-24 ENCOUNTER — Encounter (HOSPITAL_BASED_OUTPATIENT_CLINIC_OR_DEPARTMENT_OTHER): Payer: Self-pay | Admitting: *Deleted

## 2014-09-24 ENCOUNTER — Emergency Department (HOSPITAL_BASED_OUTPATIENT_CLINIC_OR_DEPARTMENT_OTHER): Payer: Self-pay

## 2014-09-24 ENCOUNTER — Other Ambulatory Visit: Payer: Self-pay

## 2014-09-24 DIAGNOSIS — F419 Anxiety disorder, unspecified: Secondary | ICD-10-CM | POA: Insufficient documentation

## 2014-09-24 DIAGNOSIS — J45909 Unspecified asthma, uncomplicated: Secondary | ICD-10-CM | POA: Insufficient documentation

## 2014-09-24 DIAGNOSIS — Z8719 Personal history of other diseases of the digestive system: Secondary | ICD-10-CM | POA: Insufficient documentation

## 2014-09-24 DIAGNOSIS — F329 Major depressive disorder, single episode, unspecified: Secondary | ICD-10-CM | POA: Insufficient documentation

## 2014-09-24 DIAGNOSIS — Z79899 Other long term (current) drug therapy: Secondary | ICD-10-CM | POA: Insufficient documentation

## 2014-09-24 DIAGNOSIS — R079 Chest pain, unspecified: Secondary | ICD-10-CM | POA: Insufficient documentation

## 2014-09-24 DIAGNOSIS — R202 Paresthesia of skin: Secondary | ICD-10-CM | POA: Insufficient documentation

## 2014-09-24 DIAGNOSIS — Z720 Tobacco use: Secondary | ICD-10-CM | POA: Insufficient documentation

## 2014-09-24 MED ORDER — PREDNISONE 20 MG PO TABS: 40.0000 mg | ORAL_TABLET | Freq: Every day | ORAL | Status: DC

## 2014-09-24 NOTE — ED Provider Notes (Signed)
CSN: [id number]     Arrival date & time 09/24/14  1215 History   First MD Initiated Contact with Patient 09/24/14 1224     Chief Complaint  Patient presents with  . Chest Pain     (Consider location/radiation/quality/duration/timing/severity/associated sxs/prior Treatment) HPI Molly Gibbs is a 24 y.o. female with hx of asthma, GERD, anxiety, presents to ED with complaint of chest pain. Pt states pain started suddenly while laughing around 10 am this morning while at work. Pt took aspirin with no imrovement. Patient states she since then has been having episodes of sharp pain that travels from left chest up into the left neck to the left back and into left arm. She states this is associated with tingling and numbness sensation of the left hand. She states this only lasts a few seconds. States when this sharp pain is not there she has dull pain to the left anterior chest. Patient denies any injuries. She states pain is worsened with palpation of the left chest and movement of her arms. She denies any pain or swelling in her extremities. She denies any recent travel or surgeries. She has an IUD for birth control. She is a current smoker. No history of the same. Denies any shortness of breath or cough.    Past Medical History  Diagnosis Date  . Depression   . Asthma   . Anxiety   . Blood transfusion without reported diagnosis   . GERD (gastroesophageal reflux disease)    Past Surgical History  Procedure Laterality Date  . Cesarean section    . Dilation and evacuation N/A 10/29/2012    Procedure: DILATATION AND EVACUATION WITH ULTRASOUND;  Surgeon: Alphonsus Sias. Ernestina Penna, MD;  Location: [facility];  Service: Gynecology;  Laterality: N/A;  CHROMOSOME STUDIES;POSSIBLE MOLAR PREGNANCY.   Bladder instillation performed   Family History  Problem Relation Age of Onset  . Arthritis Maternal Grandmother     Rheumatoid arthritis  . Cancer Maternal Grandmother     breast  . Arthritis Maternal Grandfather    . Arthritis Paternal Grandmother     Osteoarthritis  . Cancer Paternal Grandfather     lung  . Arthritis Paternal Grandfather     Osteoarthritis   History  Substance Use Topics  . Smoking status: Current Every Day Smoker -- 0.50 packs/day for 2 years    Types: Cigarettes  . Smokeless tobacco: Never Used  . Alcohol Use: Yes     Comment: occasionally 1 drink weekly   OB History    Gravida Para Term Preterm AB TAB SAB Ectopic Multiple Living   0 0 0 0 0 0 1     Review of Systems  Constitutional: Negative for fever and chills.  Respiratory: Negative for cough, chest tightness and shortness of breath.   Cardiovascular: Positive for chest pain. Negative for palpitations and leg swelling.  Gastrointestinal: Negative for nausea, vomiting, abdominal pain and diarrhea.  Musculoskeletal: Negative for myalgias, arthralgias, neck pain and neck stiffness.  Skin: Negative for rash.  Neurological: Positive for weakness and numbness. Negative for dizziness and headaches.  All other systems reviewed and are negative.     Allergies  Codeine and Imitrex  Home Medications   Prior to Admission medications   Medication Sig Start Date End Date Taking? Authorizing Provider  Aspirin-Acetaminophen-Caffeine (EXCEDRIN MIGRAINE PO) Take 1 tablet by mouth daily.    Historical Provider, MD  citalopram (CELEXA) 20 MG tablet Take 1 tablet (20 mg total) by mouth daily. 03/24/14  Elvina SidleKurt Lauenstein, MD  EPINEPHrine (EPIPEN) 0.3 mg/0.3 mL SOAJ injection Inject 0.3 mLs (0.3 mg total) into the muscle once. 07/03/13   Jonita Albeehris W Guest, MD  ibuprofen (ADVIL,MOTRIN) 200 MG tablet Take 400 mg by mouth every 6 (six) hours as needed for pain (for migraines).    Historical Provider, MD   BP 136/90 mmHg  Pulse 92  Temp(Src) 98.4 F (36.9 C) (Oral)  Resp 16  Ht 5\' 3"  (1.6 m)  Wt 150 lb (68.04 kg)  BMI 26.58 kg/m2  SpO2 100% Physical Exam  Constitutional: She appears well-developed and well-nourished. No  distress.  HENT:  Head: Normocephalic.  Eyes: Conjunctivae are normal.  Neck: Neck supple.  Cardiovascular: Normal rate, regular rhythm and normal heart sounds.   Pulmonary/Chest: Effort normal and breath sounds normal. No respiratory distress. She has no wheezes. She has no rales. She exhibits tenderness.  Tender to palpation over anterior left chest  Abdominal: Soft. Bowel sounds are normal. She exhibits no distension. There is no tenderness. There is no rebound.  Musculoskeletal: She exhibits no edema.  Pain with range of motion of the left shoulder. Tenderness to palpation over anterior and posterior shoulder joint. Tenderness to palpation over trapezius muscle.   Neurological: She is alert.  5/5 and equal grip strength bilaterally. 5/5 strength of triceps, bicep, deltoid. Sensation intact in left hand  Skin: Skin is warm and dry.  Psychiatric: She has a normal mood and affect. Her behavior is normal.  Nursing note and vitals reviewed.   ED Course  Procedures (including critical care time) Labs Review Labs Reviewed - No data to display  Imaging Review Dg Chest 2 View  09/24/2014   CLINICAL DATA:  One day history of chest pain and difficulty breathing  EXAM: CHEST  2 VIEW  COMPARISON:  None.  FINDINGS: Lungs are clear. Heart size and pulmonary vascularity are normal. No adenopathy. No pneumothorax. No bone lesions.  IMPRESSION: No edema or consolidation.   Electronically Signed   By: Bretta BangWilliam  Woodruff III M.D.   On: 09/24/2014 13:04     EKG Interpretation   Date/Time:  Friday September 24 2014 12:20:32 EDT Ventricular Rate:  88 PR Interval:  134 QRS Duration: 88 QT Interval:  360 QTC Calculation: 435 R Axis:   85 Text Interpretation:  Normal sinus rhythm with sinus arrhythmia Normal ECG  Sinus rhythm Artifact Abnormal ekg Confirmed by Gerhard MunchLOCKWOOD, ROBERT  MD  ([id number]) on 09/24/2014 12:38:18 PM      MDM   Final diagnoses:  Chest pain, unspecified chest pain type  Paresthesia     Patient with atypical chest pain that started while she was laughing at work. Pain is still however she gets episodes of sharp pain that radiates from left anterior chest into the left upper back into the left arm and hand with associated hand tingling. Chest pain is reproducible on palpation of the chest and with movement of the left arm. Her vital signs are normal. Chest x-ray is negative. EKG is normal. I do not think patient is having acute ACS, she has no risk factors, normal EKG, atypical pain. Patient is negative on both  Wells criteria and PERC criteria. I do not think she has a pulmonary embolism. Most likely muscular versus radicular pain. Will treat with a short course of prednisone for possible radiculopathy given she reports numbness and tingling sensation in the left hand. Follow-up with primary care doctor.  Filed Vitals:   09/24/14 1224  BP: 136/90  Pulse: 92  Temp: 98.4 F (36.9 C)  TempSrc: Oral  Resp: 16  Height:  (1.6 m)  Weight: 150 lb (68.04 kg)  SpO2: 100%     Jaynie Crumble, PA-C 09/24/14 1352  Gerhard Munch, MD 09/24/14 1527

## 2014-09-24 NOTE — ED Notes (Signed)
Pt reports at 10am, she had sudden onset of chest pain radiating into her (L) shoulder and back.  Reports nausea, SOB.  States that she was laughing when it happened.

## 2014-09-24 NOTE — Discharge Instructions (Signed)
Take tylenol for pain. Prednisone for possible nerve inflammation. Rest. Avoid heavy lifting for several days. Follow up with your doctor next week.   Chest Wall Pain Chest wall pain is pain in or around the bones and muscles of your chest. It may take up to 6 weeks to get better. It may take longer if you must stay physically active in your work and activities.  CAUSES  Chest wall pain may happen on its own. However, it may be caused by:  A viral illness like the flu.  Injury.  Coughing.  Exercise.  Arthritis.  Fibromyalgia.  Shingles. HOME CARE INSTRUCTIONS   Avoid overtiring physical activity. Try not to strain or perform activities that cause pain. This includes any activities using your chest or your abdominal and side muscles, especially if heavy weights are used.  Put ice on the sore area.  Put ice in a plastic bag.  Place a towel between your skin and the bag.  Leave the ice on for 15-20 minutes per hour while awake for the first 2 days.  Only take over-the-counter or prescription medicines for pain, discomfort, or fever as directed by your caregiver. SEEK IMMEDIATE MEDICAL CARE IF:   Your pain increases, or you are very uncomfortable.  You have a fever.  Your chest pain becomes worse.  You have new, unexplained symptoms.  You have nausea or vomiting.  You feel sweaty or lightheaded.  You have a cough with phlegm (sputum), or you cough up blood. MAKE SURE YOU:   Understand these instructions.  Will watch your condition.  Will get help right away if you are not doing well or get worse. Document Released: 05/14/2005 Document Revised: 08/06/2011 Document Reviewed: 01/08/2011 [facility] Patient Information 2015 [employer] [place]. This information is not intended to replace advice given to you by your health care provider. Make sure you discuss any questions you have with your health care provider. Radicular Pain Radicular pain in either the arm or leg is  usually from a bulging or herniated disk in the spine. A piece of the herniated disk may press against the nerves as the nerves exit the spine. This causes pain which is felt at the tips of the nerves down the arm or leg. Other causes of radicular pain may include:  Fractures.  Heart disease.  Cancer.  An abnormal and usually degenerative state of the nervous system or nerves (neuropathy). Diagnosis may require CT or MRI scanning to determine the primary cause.  Nerves that start at the neck (nerve roots) may cause radicular pain in the outer shoulder and arm. It can spread down to the thumb and fingers. The symptoms vary depending on which nerve root has been affected. In most cases radicular pain improves with conservative treatment. Neck problems may require physical therapy, a neck collar, or cervical traction. Treatment may take many weeks, and surgery may be considered if the symptoms do not improve.  Conservative treatment is also recommended for sciatica. Sciatica causes pain to radiate from the lower back or buttock area down the leg into the foot. Often there is a history of back problems. Most patients with sciatica are better after 2 to 4 weeks of rest and other supportive care. Short term bed rest can reduce the disk pressure considerably. Sitting, however, is not a good position since this increases the pressure on the disk. You should avoid bending, lifting, and all other activities which make the problem worse. Traction can be used in severe cases. Surgery is  usually reserved for patients who do not improve within the first months of treatment. Only take over-the-counter or prescription medicines for pain, discomfort, or fever as directed by your caregiver. Narcotics and muscle relaxants may help by relieving more severe pain and spasm and by providing mild sedation. Cold or massage can give significant relief. Spinal manipulation is not recommended. It can increase the degree of disc  protrusion. Epidural steroid injections are often effective treatment for radicular pain. These injections deliver medicine to the spinal nerve in the space between the protective covering of the spinal cord and back bones (vertebrae). Your caregiver can give you more information about steroid injections. These injections are most effective when given within two weeks of the onset of pain.  You should see your caregiver for follow up care as recommended. A program for neck and back injury rehabilitation with stretching and strengthening exercises is an important part of management.  SEEK IMMEDIATE MEDICAL CARE IF:  You develop increased pain, weakness, or numbness in your arm or leg.  You develop difficulty with bladder or bowel control.  You develop abdominal pain. Document Released: 06/21/2004 Document Revised: 08/06/2011 Document Reviewed: 09/06/2008 [facility] Patient Information 2015 [place], [place]. This information is not intended to replace advice given to you by your health care provider. Make sure you discuss any questions you have with your health care provider.

## 2014-09-24 NOTE — ED Notes (Addendum)
Pt reports hx of anxiety but didn't feel anxious. She took 1 ASA 1 hour post the chest discomfort. Pt is alert and oriented. Appears to be comfortable with boyfriend at bedside. Denies cough

## 2015-03-01 ENCOUNTER — Encounter: Payer: Self-pay | Admitting: Emergency Medicine

## 2015-03-24 ENCOUNTER — Telehealth: Payer: Self-pay

## 2015-03-24 NOTE — Telephone Encounter (Signed)
Error

## 2015-03-24 NOTE — Telephone Encounter (Signed)
Waiting on payment of $49.75 for 88 pages  Before sending records.

## 2015-04-06 DIAGNOSIS — Z0271 Encounter for disability determination: Secondary | ICD-10-CM

## 2015-04-06 NOTE — Telephone Encounter (Signed)
Payment was received and records were sent via fax on 04/06/15.

## 2015-10-27 DIAGNOSIS — F4322 Adjustment disorder with anxiety: Secondary | ICD-10-CM | POA: Diagnosis not present

## 2015-11-10 DIAGNOSIS — F4322 Adjustment disorder with anxiety: Secondary | ICD-10-CM | POA: Diagnosis not present

## 2015-12-08 DIAGNOSIS — F4322 Adjustment disorder with anxiety: Secondary | ICD-10-CM | POA: Diagnosis not present

## 2016-01-05 DIAGNOSIS — F4322 Adjustment disorder with anxiety: Secondary | ICD-10-CM | POA: Diagnosis not present

## 2016-02-13 DIAGNOSIS — J029 Acute pharyngitis, unspecified: Secondary | ICD-10-CM | POA: Diagnosis not present

## 2016-02-13 DIAGNOSIS — R509 Fever, unspecified: Secondary | ICD-10-CM | POA: Diagnosis not present

## 2016-02-13 DIAGNOSIS — J020 Streptococcal pharyngitis: Secondary | ICD-10-CM | POA: Diagnosis not present

## 2016-02-16 DIAGNOSIS — F4322 Adjustment disorder with anxiety: Secondary | ICD-10-CM | POA: Diagnosis not present

## 2016-02-23 DIAGNOSIS — F4322 Adjustment disorder with anxiety: Secondary | ICD-10-CM | POA: Diagnosis not present

## 2016-03-15 DIAGNOSIS — F4322 Adjustment disorder with anxiety: Secondary | ICD-10-CM | POA: Diagnosis not present

## 2016-04-04 DIAGNOSIS — F4322 Adjustment disorder with anxiety: Secondary | ICD-10-CM | POA: Diagnosis not present

## 2016-04-12 DIAGNOSIS — F4322 Adjustment disorder with anxiety: Secondary | ICD-10-CM | POA: Diagnosis not present

## 2016-05-10 DIAGNOSIS — F4322 Adjustment disorder with anxiety: Secondary | ICD-10-CM | POA: Diagnosis not present

## 2016-07-07 ENCOUNTER — Emergency Department (HOSPITAL_BASED_OUTPATIENT_CLINIC_OR_DEPARTMENT_OTHER)
Admission: EM | Admit: 2016-07-07 | Discharge: 2016-07-08 | Disposition: A | Discharge status: Home / Self Care | Payer: BLUE CROSS/BLUE SHIELD | Attending: Emergency Medicine | Admitting: Emergency Medicine

## 2016-07-07 ENCOUNTER — Encounter (HOSPITAL_BASED_OUTPATIENT_CLINIC_OR_DEPARTMENT_OTHER): Payer: Self-pay | Admitting: *Deleted

## 2016-07-07 DIAGNOSIS — Z7982 Long term (current) use of aspirin: Secondary | ICD-10-CM | POA: Diagnosis not present

## 2016-07-07 DIAGNOSIS — F17210 Nicotine dependence, cigarettes, uncomplicated: Secondary | ICD-10-CM | POA: Insufficient documentation

## 2016-07-07 DIAGNOSIS — T7840XA Allergy, unspecified, initial encounter: Secondary | ICD-10-CM | POA: Insufficient documentation

## 2016-07-07 DIAGNOSIS — Z791 Long term (current) use of non-steroidal anti-inflammatories (NSAID): Secondary | ICD-10-CM | POA: Insufficient documentation

## 2016-07-07 DIAGNOSIS — J45909 Unspecified asthma, uncomplicated: Secondary | ICD-10-CM | POA: Insufficient documentation

## 2016-07-07 MED ORDER — FAMOTIDINE 20 MG PO TABS
20.0000 mg | ORAL_TABLET | Freq: Once | ORAL | Status: AC
  Administered 2016-07-08: 20 mg via ORAL
  Filled 2016-07-07: qty 1

## 2016-07-07 MED ORDER — PREDNISONE 50 MG PO TABS
60.0000 mg | ORAL_TABLET | Freq: Once | ORAL | Status: AC
  Administered 2016-07-08: 60 mg via ORAL
  Filled 2016-07-07: qty 1

## 2016-07-07 MED ORDER — DIPHENHYDRAMINE HCL 25 MG PO CAPS
25.0000 mg | ORAL_CAPSULE | Freq: Once | ORAL | Status: AC
  Administered 2016-07-08: 25 mg via ORAL
  Filled 2016-07-07: qty 1

## 2016-07-07 MED ORDER — IPRATROPIUM-ALBUTEROL 0.5-2.5 (3) MG/3ML IN SOLN
3.0000 mL | Freq: Once | RESPIRATORY_TRACT | Status: AC
  Administered 2016-07-07: 3 mL via RESPIRATORY_TRACT
  Filled 2016-07-07: qty 3

## 2016-07-07 NOTE — ED Provider Notes (Signed)
[facility] Provider Note   CSN: [id number] Arrival date & time: 07/07/16  2102  By signing my name below, I, Modena Jansky, attest that this documentation has been prepared under the direction and in the presence of non-physician practitioner, Azucena Kuba, PA-C. Electronically Signed: Modena Jansky, Scribe. 07/07/2016. 11:24 PM.  History   Chief Complaint Chief Complaint  Patient presents with  . Allergic Reaction   The history is provided by the patient. No language interpreter was used.   HPI Comments: Molly Gibbs is a 26 y.o. female with a PMHx of asthmatic bronchitis who presents to the Emergency Department complaining of an allergic reaction that started about 3.5 hours ago. She states she took zyrtec medication for her cat allergy. She started having associated wheezing and SOB immediately after taking the medication. She is currently resolved. Has not had any further difficulty . She has allergies to codeine and Imitrex with anaphylaxis to imitrex. She denies any fever, nausea, vomiting, rash, skin itching, lightheadedness, dizziness, headache, vision changes, chest pain, shortness breath, abdominal pain, emesis, urinary symptoms or other complaints.     PCP: Lucilla Edin, MD  Past Medical History:  Diagnosis Date  . Anxiety   . Asthma   . Blood transfusion without reported diagnosis   . Depression   . GERD (gastroesophageal reflux disease)     Patient Active Problem List   Diagnosis Date Noted  . Knee pain 06/27/2012  . Asthma 06/27/2011    Past Surgical History:  Procedure Laterality Date  . CESAREAN SECTION    . DILATION AND EVACUATION N/A 10/29/2012   Procedure: DILATATION AND EVACUATION WITH ULTRASOUND;  Surgeon: Alphonsus Sias. Ernestina Penna, MD;  Location: [facility];  Service: Gynecology;  Laterality: N/A;  CHROMOSOME STUDIES;POSSIBLE MOLAR PREGNANCY.   Bladder instillation performed    OB History    Gravida Para Term Preterm AB Living   2 1 1  0 0 1   SAB TAB Ectopic Multiple Live Births   0 0 0 0         Home Medications    Prior to Admission medications   Medication Sig Start Date End Date Taking? Authorizing Provider  Aspirin-Acetaminophen-Caffeine (EXCEDRIN MIGRAINE PO) Take 1 tablet by mouth daily.    Historical Provider, MD  citalopram (CELEXA) 20 MG tablet Take 1 tablet (20 mg total) by mouth daily. 03/24/14   [facility], MD  EPINEPHrine (EPIPEN) 0.3 mg/0.3 mL SOAJ injection Inject 0.3 mLs (0.3 mg total) into the muscle once. 07/03/13   Jonita Albee, MD  ibuprofen (ADVIL,MOTRIN) 200 MG tablet Take 400 mg by mouth every 6 (six) hours as needed for pain (for migraines).    Historical Provider, MD    Family History Family History  Problem Relation Age of Onset  . Arthritis Maternal Grandmother     Rheumatoid arthritis  . Cancer Maternal Grandmother     breast  . Arthritis Maternal Grandfather   . Arthritis Paternal Grandmother     Osteoarthritis  . Cancer Paternal Grandfather     lung  . Arthritis Paternal Grandfather     Osteoarthritis    Social History Social History  Substance Use Topics  . Smoking status: Current Every Day Smoker    Packs/day: 0.50    Years: 2.00    Types: Cigarettes  . Smokeless tobacco: Never Used  . Alcohol use Yes     Comment: occasionally 1 drink weekly     Allergies   Codeine and Imitrex [sumatriptan]   Review  of Systems Review of Systems  Constitutional: Negative for fever.  HENT: Positive for sneezing. Negative for congestion, sore throat, trouble swallowing and voice change.   Eyes: Negative for visual disturbance.  Respiratory: Positive for shortness of breath and wheezing.   Cardiovascular: Negative for chest pain.  Gastrointestinal: Negative for abdominal pain, nausea and vomiting.  Musculoskeletal: Negative.   Skin: Negative.  Negative for rash.  All other systems reviewed and are negative.    Physical Exam Updated Vital Signs Vitals:   07/07/16 2108  07/08/16 0006  BP: 147/96 112/67  Pulse: 91 88  Resp: 16 14  Temp: 98 F (36.7 C) 98.1 F (36.7 C)    Physical Exam  Constitutional: She is oriented to person, place, and time. She appears well-developed and well-nourished. No distress.  Patient is nontoxic appearing. She is in no acute distress. Speaking in complete sentences.  HENT:  Head: Normocephalic and atraumatic.  Mouth/Throat: Oropharynx is clear and moist.  Managing her secretions. Oropharynx is clear. No edema. Uvula is midline.  Eyes: Conjunctivae are normal. Pupils are equal, round, and reactive to light. Right eye exhibits no discharge. Left eye exhibits no discharge. No scleral icterus.  Neck: Normal range of motion. Neck supple. No thyromegaly present.  Cardiovascular: Normal rate, regular rhythm, normal heart sounds and intact distal pulses.   Pulmonary/Chest: Effort normal and breath sounds normal. No respiratory distress. She has no wheezes. She exhibits no tenderness.  Lungs clear to auscultation bilaterally. Speaking in complete sentences and maintaining his airway. In no respiratory distress noted.  Abdominal: Soft. Bowel sounds are normal. She exhibits no distension. There is no tenderness. There is no rebound and no guarding.  Musculoskeletal: Normal range of motion.  Lymphadenopathy:    She has no cervical adenopathy.  Neurological: She is alert and oriented to person, place, and time. GCS eye subscore is 4. GCS verbal subscore is 5. GCS motor subscore is 6.  Skin: Skin is warm and dry. Capillary refill takes less than 2 seconds.  Nursing note and vitals reviewed.    ED Treatments / Results  DIAGNOSTIC STUDIES: Oxygen Saturation is 100% on RA, normal by my interpretation.    COORDINATION OF CARE: 11:28 PM- Pt advised of plan for treatment and pt agrees.  Labs (all labs ordered are listed, but only abnormal results are displayed) Labs Reviewed - No data to display  EKG  EKG Interpretation None         Radiology No results found.  Procedures Procedures (including critical care time)  Medications Ordered in ED Medications  ipratropium-albuterol (DUONEB) 0.5-2.5 (3) MG/3ML nebulizer solution 3 mL (not administered)  diphenhydrAMINE (BENADRYL) capsule 25 mg (not administered)  famotidine (PEPCID) tablet 20 mg (not administered)  predniSONE (DELTASONE) tablet 60 mg (not administered)     Initial Impression / Assessment and Plan / ED Course  I have reviewed the triage vital signs and the nursing notes.  Pertinent labs & imaging results that were available during my care of the patient were reviewed by me and considered in my medical decision making (see chart for details).     The patient presented with likely allergic reaction to Zyrtec. Has been 6 hours since symptoms onset. The patient is nontoxic appearing in the ED. She has had no symptoms since checking in the ED. Patient re-evaluated prior to dc, is hemodynamically stable, in no respiratory distress, and denies the feeling of throat closing.  patient was given Pepcid, albuterol treatment, steroids, Benadryl, ED. Pt has been  advised to take OTC benadryl & return to the ED if they have a mod-severe allergic rxn (s/s including throat closing, difficulty breathing, swelling of lips face or tongue).  patient was given 3 day course of steroids. Vital signs are stable. She is afebrile and not tachycardic. She is not hypoxic or tachypnea. Pt is to follow up with their PCP. Pt is agreeable with plan & verbalizes understanding.   Final Clinical Impressions(s) / ED Diagnoses   Final diagnoses:  Allergic reaction, initial encounter    New Prescriptions New Prescriptions   PREDNISONE (DELTASONE) 20 MG TABLET    Take 2 tablets (40 mg total) by mouth daily with breakfast.   I personally performed the services described in this documentation, which was scribed in my presence. The recorded information has been reviewed and is  accurate.     Rise Mu, PA-C 07/08/16 0131    Rolland Porter, MD 07/15/16 [phone]

## 2016-07-07 NOTE — ED Notes (Signed)
RT at triage

## 2016-07-07 NOTE — ED Triage Notes (Signed)
Pt reports that she is allergic to cats.  States that she has moved in with family that has cats.  Reports that she took allergy medication and felt increased SOB.  No respiratory distress at this time.  No wheezing, BS clear bilaterally.  Pt does sound hoarse.  No throat swelling noted.

## 2016-07-08 MED ORDER — PREDNISONE 20 MG PO TABS: 40.0000 mg | ORAL_TABLET | Freq: Every day | ORAL | 0 refills | Status: DC

## 2016-07-08 NOTE — Discharge Instructions (Signed)
Please do not take this medication anymore. Take Benadryl and complete 3 more days of prednisone. Use Flonase to help with allergies. Follow-up with her primary care doctor. Return to ED if you develop any worsening breathing, worsening throat swelling or fitting the reason.

## 2016-07-14 DIAGNOSIS — F4322 Adjustment disorder with anxiety: Secondary | ICD-10-CM | POA: Diagnosis not present

## 2016-09-08 DIAGNOSIS — F4322 Adjustment disorder with anxiety: Secondary | ICD-10-CM | POA: Diagnosis not present

## 2016-10-06 DIAGNOSIS — F4322 Adjustment disorder with anxiety: Secondary | ICD-10-CM | POA: Diagnosis not present

## 2016-10-27 DIAGNOSIS — F4323 Adjustment disorder with mixed anxiety and depressed mood: Secondary | ICD-10-CM | POA: Diagnosis not present

## 2016-11-03 DIAGNOSIS — F4323 Adjustment disorder with mixed anxiety and depressed mood: Secondary | ICD-10-CM | POA: Diagnosis not present

## 2016-12-01 DIAGNOSIS — F4323 Adjustment disorder with mixed anxiety and depressed mood: Secondary | ICD-10-CM | POA: Diagnosis not present

## 2016-12-15 DIAGNOSIS — F4322 Adjustment disorder with anxiety: Secondary | ICD-10-CM | POA: Diagnosis not present

## 2016-12-19 DIAGNOSIS — F4322 Adjustment disorder with anxiety: Secondary | ICD-10-CM | POA: Diagnosis not present

## 2016-12-26 DIAGNOSIS — F4322 Adjustment disorder with anxiety: Secondary | ICD-10-CM | POA: Diagnosis not present

## 2017-01-10 DIAGNOSIS — F4323 Adjustment disorder with mixed anxiety and depressed mood: Secondary | ICD-10-CM | POA: Diagnosis not present

## 2017-02-12 DIAGNOSIS — F4322 Adjustment disorder with anxiety: Secondary | ICD-10-CM | POA: Diagnosis not present

## 2017-03-14 DIAGNOSIS — F4323 Adjustment disorder with mixed anxiety and depressed mood: Secondary | ICD-10-CM | POA: Diagnosis not present

## 2017-04-23 DIAGNOSIS — F4323 Adjustment disorder with mixed anxiety and depressed mood: Secondary | ICD-10-CM | POA: Diagnosis not present

## 2017-05-09 DIAGNOSIS — F4323 Adjustment disorder with mixed anxiety and depressed mood: Secondary | ICD-10-CM | POA: Diagnosis not present

## 2017-06-06 DIAGNOSIS — F4322 Adjustment disorder with anxiety: Secondary | ICD-10-CM | POA: Diagnosis not present

## 2017-07-04 DIAGNOSIS — F4323 Adjustment disorder with mixed anxiety and depressed mood: Secondary | ICD-10-CM | POA: Diagnosis not present

## 2017-08-01 DIAGNOSIS — F4323 Adjustment disorder with mixed anxiety and depressed mood: Secondary | ICD-10-CM | POA: Diagnosis not present

## 2017-09-26 DIAGNOSIS — F4323 Adjustment disorder with mixed anxiety and depressed mood: Secondary | ICD-10-CM | POA: Diagnosis not present

## 2017-10-02 ENCOUNTER — Other Ambulatory Visit: Payer: Self-pay

## 2017-10-02 ENCOUNTER — Ambulatory Visit: Payer: BLUE CROSS/BLUE SHIELD | Admitting: Physician Assistant

## 2017-10-02 ENCOUNTER — Encounter: Payer: Self-pay | Admitting: Physician Assistant

## 2017-10-02 VITALS — BP 112/76 | HR 98 | Temp 98.6°F | Resp 18 | Ht 64.06 in | Wt 164.8 lb

## 2017-10-02 DIAGNOSIS — F419 Anxiety disorder, unspecified: Secondary | ICD-10-CM | POA: Diagnosis not present

## 2017-10-02 DIAGNOSIS — F329 Major depressive disorder, single episode, unspecified: Secondary | ICD-10-CM

## 2017-10-02 DIAGNOSIS — R4588 Nonsuicidal self-harm: Secondary | ICD-10-CM

## 2017-10-02 DIAGNOSIS — R4589 Other symptoms and signs involving emotional state: Secondary | ICD-10-CM

## 2017-10-02 MED ORDER — ESCITALOPRAM OXALATE 10 MG PO TABS: 10.0000 mg | ORAL_TABLET | Freq: Every day | ORAL | 0 refills | Status: DC

## 2017-10-02 NOTE — Patient Instructions (Addendum)
For depression and anxiety, I recommend we start a daily SSRI and continue therapy. The SSRI I am prescribing is called lexapro. Please keep in mind that when you start an SSRI it can worsen your depression and anxiety symptoms. It can also increase risk of suicidal thoughts so if this occurs, please seek care immediately. Common side effects of SSRIs include, but are not limited to, GI upset, nausea, vomiting, insomnia, and drowsiness. Typically these side effects will resolve after 2 weeks. Please keep in mind that it can take up to 4-6 weeks for SSRIs to be fully effective. Plan to return on Monday for reevaluation. I have placed a referral for psych and they should contact you within the next 2 weeks. If any of your symptoms worsen over the weekend, go to [facility] ED.    Suicidal Feelings: How to Help Yourself Suicide is the taking of one's own life. If you feel as though life is getting too tough to handle and are thinking about suicide, get help right away. To get help:  Call your local emergency services (911 in the [place]  Call a suicide hotline to speak with a trained counselor who understands how you are feeling. The following is a list of suicide hotlines in the [place]. For a list of hotlines in [place], visit [employer]it. ? 1-800-273-TALK (947)109-6550). ? 1-800-SUICIDE (938)146-3988). ? 440 412 6125. This is a hotline for Spanish speakers. ? 1-800-799-4TTY (213) 221-3951). This is a hotline for TTY users. ? 1-866-4-U-TREVOR (814)866-0599). This is a hotline for lesbian, gay, bisexual, transgender, or questioning youth.  Contact a crisis center or a local suicide prevention center. To find a crisis center or suicide prevention center: ? Call your local hospital, clinic, community service organization, mental health center, social service provider, or health department. Ask for assistance in connecting to a crisis  center. ? Visit https://www.patel-king.com/ for a list of crisis centers in the [place], or visit www.suicideprevention.ca/thinking-about-suicide/find-a-crisis-centre for a list of centers in [place].  Visit the following websites: ? National Suicide Prevention Lifeline: www.suicidepreventionlifeline.org ? Hopeline: www.hopeline.com ? [employer] for Suicide Prevention: https://www.ayers.com/ ? [employer] (for lesbian, gay, bisexual, transgender, or questioning youth): www.thetrevorproject.org  How can I help myself feel better?  Promise yourself that you will not do anything drastic when you have suicidal feelings. Remember, there is hope. Many people have gotten through suicidal thoughts and feelings, and you will, too. You may have gotten through them before, and this proves that you can get through them again.  Let family, friends, teachers, or counselors know how you are feeling. Try not to isolate yourself from those who care about you. Remember, they will want to help you. Talk with someone every day, even if you do not feel sociable. Face-to-face conversation is best.  Call a mental health professional and see one regularly.  Visit your primary health care provider every year.  Eat a well-balanced diet, and space your meals so you eat regularly.  Get plenty of rest.  Avoid alcohol and drugs, and remove them from your home. They will only make you feel worse.  If you are thinking of taking a lot of medicine, give your medicine to someone who can give it to you one day at a time. If you are on antidepressants and are concerned you will overdose, let your health care provider know so he or she can give you safer medicines. Ask your mental health professional about the possible side effects of any medicines you are taking.  Remove weapons, poisons, knives, and anything else that could harm you from your home.  Try to stick to routines. Follow a  schedule every day. Put self-care on your schedule.  Make a list of realistic goals, and cross them off when you achieve them. Accomplishments give a sense of worth.  Wait until you are feeling better before doing the things you find difficult or unpleasant.  Exercise if you are able. You will feel better if you exercise for even a half hour each day.  Go out in the sun or into nature. This will help you recover from depression faster. If you have a favorite place to walk, go there.  Do the things that have always given you pleasure. Play your favorite music, read a good book, paint a picture, play your favorite instrument, or do anything else that takes your mind off your depression if it is safe to do.  Keep your living space well lit.  When you are feeling well, write yourself a letter about tips and support that you can read when you are not feeling well.  Remember that life's difficulties can be sorted out with help. Conditions can be treated. You can work on thoughts and strategies that serve you well. This information is not intended to replace advice given to you by your health care provider. Make sure you discuss any questions you have with your health care provider. Document Released: 11/18/2002 Document Revised: 01/11/2016 Document Reviewed: 09/08/2013 Elsevier Interactive Patient Education  2018 [employer].    IF you received an x-ray today, you will receive an invoice from [facility] Radiology. Please contact [facility]iology at [phone] with questions or concerns regarding your invoice.   IF you received labwork today, you will receive an invoice from [facility]. Please contact [facility] at [phone] with questions or concerns regarding your invoice.   Our billing staff will not be able to assist you with questions regarding bills from these companies.  You will be contacted with the lab results as soon as they are available. The fastest way to get your results  is to activate your My Chart account. Instructions are located on the last page of this paperwork. If you have not heard from [place] regarding the results in 2 weeks, please contact this office.

## 2017-10-02 NOTE — Progress Notes (Signed)
Tuere Nwosu  MRN: [id number] DOB: 1990/08/29  Subjective:  Molly Gibbs is a 27 y.o. female seen in office today for a chief complaint of worsening depression and anxiety. Used to be on medication. Stopped about 1.5-2 years ago. Started doing therapy and has continued every other week.Today, states "I feel crazy." Has been having suicidal thoughts but no plan. Has been cutting since age 56. Still does this when she can, but not "in attempt to kill myself, just to feel something." Last cutting was last night on left arm. Denies cutting deep enough to draw blood. No overlying erythema, warmth, or purulent drainage noted. Notes she has stressors both at home and work. Has decreased concentration, inability to focus, feels down on herself, irritability, sleep disturbance, and  racing thoughts.  Denies hallucinations,mania, delusions, paranoia, rapid speech, decreased need for sleep, and euphoria. She is not suicidal today. Does not want to go to ED because she does not want to be put in psych ward and states that would make her feel more crazy than she already feels. Would just like to be on medication again and continue with counseling as medication really helped in the past. She has already contacted her counselor about this.   Meds tried in the past: Celexa: Worked well for about 6-7 months then stopped it Imitrex: Allergic to this  FH of anxiety, deptression, and suicidal attempts in [place].   Review of Systems  Constitutional: Negative for chills, diaphoresis and fever.  Psychiatric/Behavioral: Negative for agitation, confusion and hallucinations. The patient is not hyperactive.     Patient Active Problem List   Diagnosis Date Noted  . Knee pain 06/27/2012  . Asthma 06/27/2011    Current Outpatient Medications on File Prior to Visit  Medication Sig Dispense Refill  . Aspirin-Acetaminophen-Caffeine (EXCEDRIN MIGRAINE PO) Take 1 tablet by mouth daily.    Marland Kitchen EPINEPHrine (EPIPEN) 0.3  mg/0.3 mL SOAJ injection Inject 0.3 mLs (0.3 mg total) into the muscle once. 2 Device 3  . ibuprofen (ADVIL,MOTRIN) 200 MG tablet Take 400 mg by mouth every 6 (six) hours as needed for pain (for migraines).    . citalopram (CELEXA) 20 MG tablet Take 1 tablet (20 mg total) by mouth daily. (Patient not taking: Reported on 10/02/2017) 30 tablet 3  . predniSONE (DELTASONE) 20 MG tablet Take 2 tablets (40 mg total) by mouth daily with breakfast. (Patient not taking: Reported on 10/02/2017) 6 tablet 0   No current facility-administered medications on file prior to visit.     Allergies  Allergen Reactions  . Codeine     swelling  . Imitrex [Sumatriptan] Swelling    tongue      Objective:  BP 112/76 (BP Location: Right Arm, Patient Position: Sitting, Cuff Size: Normal)   Pulse 98   Temp 98.6 F (37 C) (Oral)   Resp 18   Ht 5' 4.06" (1.627 m)   Wt 164 lb 12.8 oz (74.8 kg)   SpO2 95%   BMI 28.24 kg/m   Physical Exam  Constitutional: She is oriented to person, place, and time. She appears well-developed and well-nourished.  HENT:  Head: Normocephalic and atraumatic.  Eyes: Conjunctivae are normal.  Neck: Normal range of motion.  Cardiovascular:  Pulses:      Radial pulses are 2+ on the right side, and 2+ on the left side.  Pulmonary/Chest: Effort normal.  Musculoskeletal:       Left wrist: Normal.       Right forearm: Normal.  Left forearm: She exhibits no tenderness and no bony tenderness.  Neurological: She is alert and oriented to person, place, and time.  Skin: Skin is warm and dry.     Psychiatric: She exhibits a depressed mood.  Vitals reviewed.   Multiple superficical perpendicular lines on proximal forearm,   GAD 7 : Generalized Anxiety Score 10/02/2017  Nervous, Anxious, on Edge 3  Control/stop worrying 3  Worry too much - different things 2  Trouble relaxing 1  Restless 0  Easily annoyed or irritable 3  Afraid - awful might happen 1  Total GAD 7 Score 13    Anxiety Difficulty Very difficult    Depression screen [facility] 2/9 10/02/2017 07/16/2014 04/09/2014  Decreased Interest 1 0 2  Down, Depressed, Hopeless 3 0 1  PHQ - 2 Score 4 0 3  Altered sleeping 3 - 3  Tired, decreased energy 3 - 3  Change in appetite 3 - 1  Feeling bad or failure about yourself  2 - 2  Trouble concentrating 3 - 3  Moving slowly or fidgety/restless 3 - 1  Suicidal thoughts 1 - 1  PHQ-9 Score 22 - 17  Difficult doing work/chores Very difficult - -    Assessment and Plan :  1. Anxiety and depression 2. Non-suicidal self harm [facility] and was informed that with patient's current state, she would not meet inpatient criteria.  They recommended outpatient treatment and close follow-up.  Although patient has performed self-harm, she is not currently suicidal.  Plan to start Lexapro today.  Educated on potential side effects.  Also educated patient that starting an SSRI could potentially worsen her anxiety and depression.  Strongly encouraged her to seek care as [facility] if any of her symptoms worsen or she develops new concerning symptoms.  Also given information for suicidal hotline.  Encouraged her to follow-up with her counselor in the next couple days.  Plan to follow-up with me in 5 days for reevaluation. - escitalopram (LEXAPRO) 10 MG tablet; Take 1 tablet (10 mg total) by mouth daily.  Dispense: 90 tablet; Refill: 0 - Ambulatory referral to Psychiatry - CBC with Differential/Platelet - TSH - CMP14+EGFR  A total of 25 minutes was spent in the room with the patient, greater than 50% of which was in counseling/coordination of care regarding anxiety and depression.  Tenna Delaine PA-C  [facility] 10/02/2017 10:03 AM

## 2017-10-03 LAB — CMP14+EGFR
ALT: 15 IU/L (ref 0–32)
AST: 16 IU/L (ref 0–40)
Albumin/Globulin Ratio: 2 (ref 1.2–2.2)
Albumin: 4.9 g/dL (ref 3.5–5.5)
Alkaline Phosphatase: 79 IU/L (ref 39–117)
BILIRUBIN TOTAL: 0.8 mg/dL (ref 0.0–1.2)
BUN/Creatinine Ratio: 11 (ref 9–23)
BUN: 7 mg/dL (ref 6–20)
CO2: 22 mmol/L (ref 20–29)
CREATININE: 0.65 mg/dL (ref 0.57–1.00)
Calcium: 9.8 mg/dL (ref 8.7–10.2)
Chloride: 101 mmol/L (ref 96–106)
GFR calc non Af Amer: 121 mL/min/{1.73_m2} (ref >59)
GFR, EST AFRICAN AMERICAN: 140 mL/min/{1.73_m2} (ref >59)
GLUCOSE: 82 mg/dL (ref 65–99)
Globulin, Total: 2.5 g/dL (ref 1.5–4.5)
Potassium: 4.4 mmol/L (ref 3.5–5.2)
Sodium: 139 mmol/L (ref 134–144)
TOTAL PROTEIN: 7.4 g/dL (ref 6.0–8.5)

## 2017-10-03 LAB — CBC WITH DIFFERENTIAL/PLATELET
BASOS ABS: 0 10*3/uL (ref 0.0–0.2)
Basos: 0 %
EOS (ABSOLUTE): 0.1 10*3/uL (ref 0.0–0.4)
EOS: 1 %
HEMATOCRIT: 40.4 % (ref 34.0–46.6)
HEMOGLOBIN: 14 g/dL (ref 11.1–15.9)
IMMATURE GRANS (ABS): 0 10*3/uL (ref 0.0–0.1)
IMMATURE GRANULOCYTES: 0 %
LYMPHS: 26 %
Lymphocytes Absolute: 1.7 10*3/uL (ref 0.7–3.1)
MCH: 30.9 pg (ref 26.6–33.0)
MCHC: 34.7 g/dL (ref 31.5–35.7)
MCV: 89 fL (ref 79–97)
MONOCYTES: 8 %
Monocytes Absolute: 0.6 10*3/uL (ref 0.1–0.9)
NEUTROS PCT: 65 %
Neutrophils Absolute: 4.4 10*3/uL (ref 1.4–7.0)
Platelets: 263 10*3/uL (ref 150–379)
RBC: 4.53 x10E6/uL (ref 3.77–5.28)
RDW: 12.7 % (ref 12.3–15.4)
WBC: 6.8 10*3/uL (ref 3.4–10.8)

## 2017-10-03 LAB — TSH: TSH: 1.62 u[IU]/mL (ref 0.450–4.500)

## 2017-10-04 ENCOUNTER — Encounter: Payer: Self-pay | Admitting: Physician Assistant

## 2017-10-07 ENCOUNTER — Ambulatory Visit: Payer: BLUE CROSS/BLUE SHIELD | Admitting: Physician Assistant

## 2017-10-07 ENCOUNTER — Other Ambulatory Visit: Payer: Self-pay

## 2017-10-07 ENCOUNTER — Encounter: Payer: Self-pay | Admitting: Physician Assistant

## 2017-10-07 ENCOUNTER — Encounter: Payer: Self-pay | Admitting: *Deleted

## 2017-10-07 VITALS — BP 98/60 | HR 85 | Temp 98.9°F | Resp 18 | Ht 62.6 in | Wt 165.8 lb

## 2017-10-07 DIAGNOSIS — F329 Major depressive disorder, single episode, unspecified: Secondary | ICD-10-CM

## 2017-10-07 DIAGNOSIS — F17200 Nicotine dependence, unspecified, uncomplicated: Secondary | ICD-10-CM | POA: Diagnosis not present

## 2017-10-07 DIAGNOSIS — F419 Anxiety disorder, unspecified: Secondary | ICD-10-CM

## 2017-10-07 MED ORDER — ESCITALOPRAM OXALATE 10 MG PO TABS: 10.0000 mg | ORAL_TABLET | Freq: Every day | ORAL | 0 refills | Status: DC

## 2017-10-07 MED ORDER — NICOTINE 7 MG/24HR TD PT24: 7.0000 mg | MEDICATED_PATCH | Freq: Every day | TRANSDERMAL | 0 refills | Status: DC

## 2017-10-07 MED ORDER — NICOTINE 14 MG/24HR TD PT24: 14.0000 mg | MEDICATED_PATCH | Freq: Every day | TRANSDERMAL | 0 refills | Status: DC

## 2017-10-07 MED ORDER — NICOTINE 21 MG/24HR TD PT24: 21.0000 mg | MEDICATED_PATCH | Freq: Every day | TRANSDERMAL | 1 refills | Status: DC

## 2017-10-07 NOTE — Progress Notes (Signed)
Alfredia Desanctis  MRN: [id number] DOB: Dec 21, 1990  Subjective:  Molly Gibbs is a 27 y.o. female seen in office today for a chief complaint of follow-up on anxiety and depression.  Patient was initially seen on 10/02/17 for worsening anxiety and depression.  She was started on Lexapro daily.  Please see that note for additional details.  Today, she notes she is doing much better.  The only adverse reaction she had to the Lexapro was some GI upset  when she took without food.  Otherwise, she is tolerating medication well.  She did not get to see her counselor last week but is planning on following up with him this week.  Unfortunately, her husband had a heart attack on the day of her last office visit.  She is been a week in the hospital.  He is now doing fine.  She is determined to quit smoking at this time.She has tried nicotine gum in the past and did not like the way it made her teeth feel.  She has also tried Chantix in the past with no full relief.  She is interested in NicoDerm patches.  She smokes 1 pack/day for the past 4 years.  Denies nausea, vomiting, diarrhea, chest pain, hallucinations, SI, HI,  and new self-injurious acts.  No past medical history of hypertension, disease, thyroidism, diabetes, and seizure disorder.  Review of Systems Per HPI Patient Active Problem List   Diagnosis Date Noted  . Knee pain 06/27/2012  . Asthma 06/27/2011    Current Outpatient Medications on File Prior to Visit  Medication Sig Dispense Refill  . citalopram (CELEXA) 20 MG tablet Take 1 tablet (20 mg total) by mouth daily. 30 tablet 3  . EPINEPHrine (EPIPEN) 0.3 mg/0.3 mL SOAJ injection Inject 0.3 mLs (0.3 mg total) into the muscle once. 2 Device 3  . escitalopram (LEXAPRO) 10 MG tablet Take 1 tablet (10 mg total) by mouth daily. 90 tablet 0  . ibuprofen (ADVIL,MOTRIN) 200 MG tablet Take 400 mg by mouth every 6 (six) hours as needed for pain (for migraines).    . Aspirin-Acetaminophen-Caffeine  (EXCEDRIN MIGRAINE PO) Take 1 tablet by mouth daily.    . predniSONE (DELTASONE) 20 MG tablet Take 2 tablets (40 mg total) by mouth daily with breakfast. (Patient not taking: Reported on 10/02/2017) 6 tablet 0   No current facility-administered medications on file prior to visit.     Allergies  Allergen Reactions  . Codeine     swelling  . Imitrex [Sumatriptan] Swelling    tongue     Objective:  BP 98/60 (BP Location: Left Arm, Patient Position: Sitting, Cuff Size: Normal)   Pulse 85   Temp 98.9 F (37.2 C) (Oral)   Resp 18   Ht 5' 2.6" (1.59 m)   Wt 165 lb 12.8 oz (75.2 kg)   SpO2 97%   BMI 29.75 kg/m   Physical Exam  Constitutional: She is oriented to person, place, and time. She appears well-developed and well-nourished.  HENT:  Head: Normocephalic and atraumatic.  Eyes: Conjunctivae are normal.  Neck: Normal range of motion.  Pulmonary/Chest: Effort normal.  Neurological: She is alert and oriented to person, place, and time.  Skin: Skin is warm and dry. No abrasion noted.  Psychiatric: She has a normal mood and affect.  Smiling during exam.  Vitals reviewed.    Depression screen [facility] 2/9 10/07/2017 10/02/2017 07/16/2014 04/09/2014  Decreased Interest 2 1 0 2  Down, Depressed, Hopeless 3 3 0 1  PHQ - 2 Score 5 4 0 3  Altered sleeping 3 3 - 3  Tired, decreased energy 2 3 - 3  Change in appetite 2 3 - 1  Feeling bad or failure about yourself  1 2 - 2  Trouble concentrating 2 3 - 3  Moving slowly or fidgety/restless 1 3 - 1  Suicidal thoughts 1 1 - 1  PHQ-9 Score 17 22 - 17  Difficult doing work/chores Very difficult Very difficult - -   GAD 7 : Generalized Anxiety Score 10/07/2017 10/02/2017  Nervous, Anxious, on Edge 3 3  Control/stop worrying 2 3  Worry too much - different things 2 2  Trouble relaxing 1 1  Restless 1 0  Easily annoyed or irritable 2 3  Afraid - awful might happen 2 1  Total GAD 7 Score 13 13  Anxiety Difficulty Very difficult Very difficult       Assessment and Plan :  1. Anxiety and depression Patient has had a positive response to Lexapro.  Recommended she continue with current dose.  Follow-up with counselor as soon as possible.  Return here in 4 weeks for reevaluation. - escitalopram (LEXAPRO) 10 MG tablet; Take 1 tablet (10 mg total) by mouth daily.  Dispense: 30 tablet; Refill: 0  2. Current smoker Given information on smoking cessation.  Given Rx for NicoDerm patches.  Educated on dosing. - nicotine (NICODERM CQ - DOSED IN MG/24 HOURS) 21 mg/24hr patch; Place 1 patch (21 mg total) onto the skin daily.  Dispense: 21 patch; Refill: 1 - nicotine (NICODERM CQ - DOSED IN MG/24 HOURS) 14 mg/24hr patch; Place 1 patch (14 mg total) onto the skin daily.  Dispense: 14 patch; Refill: 0 - nicotine (NICODERM CQ - DOSED IN MG/24 HR) 7 mg/24hr patch; Place 1 patch (7 mg total) onto the skin daily.  Dispense: 14 patch; Refill: 0    Benjiman Core PA-C  Primary Care at [facility] 10/07/2017 3:10 PM

## 2017-10-07 NOTE — Patient Instructions (Addendum)
In terms of depression, continue with current dose of lexapro. Follow up with your therapist as soon as possible. Follow up with me in 1 month.   Nicoderm patch dosing instructions:  Transdermal patch: Topical: Patients smoking >10 cigarettes/day: Begin with step 1 (21 mg/day) for 6 weeks, followed by step 2 (14 mg/day) for 2 weeks; finish with step 3 (7 mg/day) for 2 weeks.   Transdermal patch: Apply new patch to nonhairy, clean, dry skin on the upper body or upper outer arm; each patch should be applied to a different site. Apply immediately after removing backing from patch; press onto skin for ~10 seconds. Patch may be worn for 16 or 24 hours. If cigarette cravings occur upon awakening, wear for 24 hours; if vivid dreams or other sleep disturbances occur, remove the patch at bedtime and apply a new patch in the morning. Do not cut patch; causes rapid evaporation, rendering the patch useless. Do not wear more than 1 patch at a time; do not leave patch on for more than 24 hours (may irritate skin). Wash hands after applying or removing patch. Discard patches by folding adhesive ends together, replace in pouch and dispose of properly in trash.   Nicotine skin patches What is this medicine? NICOTINE (NIK [place] teen) helps people stop smoking. The patches replace the nicotine found in cigarettes and help to decrease withdrawal effects. They are most effective when used in combination with a stop-smoking program. This medicine may be used for other purposes; ask your health care provider or pharmacist if you have questions. COMMON BRAND NAME(S): Habitrol, Nicoderm CQ, Nicotrol What should I tell my health care provider before I take this medicine? They need to know if you have any of these conditions: -diabetes -heart disease, angina, irregular heartbeat or previous heart attack -high blood pressure -lung disease, including asthma -overactive thyroid -pheochromocytoma -seizures or a history of  seizures -skin problems, like eczema -stomach problems or ulcers -an unusual or allergic reaction to nicotine, adhesives, other medicines, foods, dyes, or preservatives -pregnant or trying to get pregnant -breast-feeding How should I use this medicine? This medicine is for use on the skin. Follow the directions that come with the patches. Find an area of skin on your upper arm, chest, or back that is clean, dry, greaseless, undamaged and hairless. Wash hands with plain soap and water. Do not use anything that contains aloe, lanolin or glycerin as these may prevent the patch from sticking. Dry thoroughly. Remove the patch from the sealed pouch. Do not try to cut or trim the patch. Using your palm, press the patch firmly in place for 10 seconds to make sure that there is good contact with your skin. After applying the patch, wash your hands. Change the patch every day, keeping to a regular schedule. When you apply a new patch, use a new area of skin. Wait at least 1 week before using the same area again. Talk to your pediatrician regarding the use of this medicine in children. Special care may be needed. Overdosage: If you think you have taken too much of this medicine contact a poison control center or emergency room at once. NOTE: This medicine is only for you. Do not share this medicine with others. What if I miss a dose? If you forget to replace a patch, use it as soon as you can. Only use one patch at a time and do not leave on the skin for longer than directed. If a patch falls off, you  can replace it, but keep to your schedule and remove the patch at the right time. What may interact with this medicine? -medicines for asthma -medicines for blood pressure -medicines for mental depression This list may not describe all possible interactions. Give your health care provider a list of all the medicines, herbs, non-prescription drugs, or dietary supplements you use. Also tell them if you smoke, drink  alcohol, or use illegal drugs. Some items may interact with your medicine. What should I watch for while using this medicine? You should begin using the nicotine patch the day you stop smoking. It is okay if you do not succeed at your attempt to quit and have a cigarette. You can still continue your quit attempt and keep using the product as directed. Just throw away your cigarettes and get back to your quit plan. You can keep the patch in place during swimming, bathing, and showering. If your patch falls off during these activities, replace it. When you first apply the patch, your skin may itch or burn. This should go away soon. When you remove a patch, the skin may look red, but this should only last for a few days. Call your doctor or health care professional if skin redness does not go away after 4 days, if your skin swells, or if you get a rash. If you are a diabetic and you quit smoking, the effects of insulin may be increased and you may need to reduce your insulin dose. Check with your doctor or health care professional about how you should adjust your insulin dose. If you are going to have a magnetic resonance imaging (MRI) procedure, tell your MRI technician if you have this patch on your body. It must be removed before a MRI. What side effects may I notice from receiving this medicine? Side effects that you should report to your doctor or health care professional as soon as possible: -allergic reactions like skin rash, itching or hives, swelling of the face, lips, or tongue -breathing problems -changes in hearing -changes in vision -chest pain -cold sweats -confusion -fast, irregular heartbeat -feeling faint or lightheaded, falls -headache -increased saliva -skin redness that lasts more than 4 days -stomach pain -signs and symptoms of nicotine overdose like nausea; vomiting; dizziness; weakness; and rapid heartbeat Side effects that usually do not require medical attention (report  to your doctor or health care professional if they continue or are bothersome): -diarrhea -dry mouth -hiccups -irritability -nervousness or restlessness -trouble sleeping or vivid dreams This list may not describe all possible side effects. Call your doctor for medical advice about side effects. You may report side effects to FDA at 1-800-FDA-1088. Where should I keep my medicine? Keep out of the reach of children. Store at room temperature between 20 and 25 degrees C (68 and 77 degrees F). Protect from heat and light. Store in Tax inspector until ready to use. Throw away unused medicine after the expiration date. When you remove a patch, fold with sticky sides together; put in an empty opened pouch and throw away. NOTE: This sheet is a summary. It may not cover all possible information. If you have questions about this medicine, talk to your doctor, pharmacist, or health care provider.  2018 Elsevier/Gold Standard (2014-04-12 15:46:21)   IF you received an x-ray today, you will receive an invoice from [facility]. Please contact [facility] at [phone] with questions or concerns regarding your invoice.   IF you received labwork today, you will receive an invoice from  [facility]. Please contact [facility] at [phone] with questions or concerns regarding your invoice.   Our billing staff will not be able to assist you with questions regarding bills from these companies.  You will be contacted with the lab results as soon as they are available. The fastest way to get your results is to activate your My Chart account. Instructions are located on the last page of this paperwork. If you have not heard from [place] regarding the results in 2 weeks, please contact this office.

## 2017-10-08 ENCOUNTER — Encounter: Payer: Self-pay | Admitting: Physician Assistant

## 2017-10-24 DIAGNOSIS — F4323 Adjustment disorder with mixed anxiety and depressed mood: Secondary | ICD-10-CM | POA: Diagnosis not present

## 2017-11-04 ENCOUNTER — Other Ambulatory Visit: Payer: Self-pay | Admitting: Physician Assistant

## 2017-11-04 DIAGNOSIS — F329 Major depressive disorder, single episode, unspecified: Secondary | ICD-10-CM

## 2017-11-04 DIAGNOSIS — F419 Anxiety disorder, unspecified: Principal | ICD-10-CM

## 2017-11-04 MED ORDER — ESCITALOPRAM OXALATE 10 MG PO TABS: 10.0000 mg | ORAL_TABLET | Freq: Every day | ORAL | 0 refills | Status: DC

## 2017-12-03 ENCOUNTER — Encounter: Payer: Self-pay | Admitting: Physician Assistant

## 2017-12-19 DIAGNOSIS — F4323 Adjustment disorder with mixed anxiety and depressed mood: Secondary | ICD-10-CM | POA: Diagnosis not present

## 2017-12-24 ENCOUNTER — Ambulatory Visit: Payer: BLUE CROSS/BLUE SHIELD | Admitting: Physician Assistant

## 2017-12-24 NOTE — Progress Notes (Deleted)
   Molly StallsSydney Gibbs  MRN: [id number] DOB: 1991-05-15  Subjective:  Molly StallsSydney Gibbs is a 27 y.o. female seen in office today for a chief complaint of f/u on anxiety/depression and smoking cessation.  1) Anxiety/Depression: Controlled on lexapro 10mg . Today, reports ***. Has been attending counseling, which ***.   Denies nausea, vomiting, diarrhea, chest pain, hallucinations, SI, HI,  and new self-injurious acts.     2) Smoking cessation: Given Rx for nicoderm patches at last OV on 10/07/17. Today,   Review of Systems  Patient Active Problem List   Diagnosis Date Noted  . Knee pain 06/27/2012  . Asthma 06/27/2011    Current Outpatient Medications on File Prior to Visit  Medication Sig Dispense Refill  . Aspirin-Acetaminophen-Caffeine (EXCEDRIN MIGRAINE PO) Take 1 tablet by mouth daily.    Marland Kitchen. EPINEPHrine (EPIPEN) 0.3 mg/0.3 mL SOAJ injection Inject 0.3 mLs (0.3 mg total) into the muscle once. 2 Device 3  . escitalopram (LEXAPRO) 10 MG tablet Take 1 tablet (10 mg total) by mouth daily. 30 tablet 0  . ibuprofen (ADVIL,MOTRIN) 200 MG tablet Take 400 mg by mouth every 6 (six) hours as needed for pain (for migraines).    . nicotine (NICODERM CQ - DOSED IN MG/24 HOURS) 14 mg/24hr patch Place 1 patch (14 mg total) onto the skin daily. 14 patch 0  . nicotine (NICODERM CQ - DOSED IN MG/24 HOURS) 21 mg/24hr patch Place 1 patch (21 mg total) onto the skin daily. 21 patch 1  . nicotine (NICODERM CQ - DOSED IN MG/24 HR) 7 mg/24hr patch Place 1 patch (7 mg total) onto the skin daily. 14 patch 0   No current facility-administered medications on file prior to visit.     Allergies  Allergen Reactions  . Codeine     swelling  . Imitrex [Sumatriptan] Swelling    tongue     Objective:  There were no vitals taken for this visit.  Physical Exam  Assessment and Plan :  *** There are no diagnoses linked to this encounter.   Benjiman CoreBrittany Quintin Hjort PA-C  [facility] at [facility] 12/24/2017 7:33 AM

## 2018-01-16 DIAGNOSIS — F4323 Adjustment disorder with mixed anxiety and depressed mood: Secondary | ICD-10-CM | POA: Diagnosis not present

## 2018-02-20 DIAGNOSIS — F4323 Adjustment disorder with mixed anxiety and depressed mood: Secondary | ICD-10-CM | POA: Diagnosis not present

## 2018-03-26 DIAGNOSIS — F4323 Adjustment disorder with mixed anxiety and depressed mood: Secondary | ICD-10-CM | POA: Diagnosis not present

## 2018-06-05 DIAGNOSIS — F4323 Adjustment disorder with mixed anxiety and depressed mood: Secondary | ICD-10-CM | POA: Diagnosis not present

## 2018-07-03 DIAGNOSIS — F4323 Adjustment disorder with mixed anxiety and depressed mood: Secondary | ICD-10-CM | POA: Diagnosis not present

## 2018-07-10 DIAGNOSIS — K2970 Gastritis, unspecified, without bleeding: Secondary | ICD-10-CM | POA: Diagnosis not present

## 2018-07-31 DIAGNOSIS — F4323 Adjustment disorder with mixed anxiety and depressed mood: Secondary | ICD-10-CM | POA: Diagnosis not present

## 2018-08-12 ENCOUNTER — Ambulatory Visit: Payer: BLUE CROSS/BLUE SHIELD | Admitting: Emergency Medicine

## 2018-09-18 DIAGNOSIS — F4323 Adjustment disorder with mixed anxiety and depressed mood: Secondary | ICD-10-CM | POA: Diagnosis not present

## 2018-10-23 DIAGNOSIS — F4323 Adjustment disorder with mixed anxiety and depressed mood: Secondary | ICD-10-CM | POA: Diagnosis not present

## 2018-11-20 DIAGNOSIS — F4323 Adjustment disorder with mixed anxiety and depressed mood: Secondary | ICD-10-CM | POA: Diagnosis not present

## 2018-12-18 DIAGNOSIS — F4323 Adjustment disorder with mixed anxiety and depressed mood: Secondary | ICD-10-CM | POA: Diagnosis not present

## 2019-01-15 DIAGNOSIS — F4323 Adjustment disorder with mixed anxiety and depressed mood: Secondary | ICD-10-CM | POA: Diagnosis not present

## 2019-02-12 DIAGNOSIS — F4323 Adjustment disorder with mixed anxiety and depressed mood: Secondary | ICD-10-CM | POA: Diagnosis not present

## 2019-02-27 ENCOUNTER — Telehealth (INDEPENDENT_AMBULATORY_CARE_PROVIDER_SITE_OTHER): Payer: BC Managed Care – PPO | Admitting: Adult Health Nurse Practitioner

## 2019-02-27 ENCOUNTER — Encounter: Payer: Self-pay | Admitting: Adult Health Nurse Practitioner

## 2019-02-27 ENCOUNTER — Encounter (INDEPENDENT_AMBULATORY_CARE_PROVIDER_SITE_OTHER): Payer: Self-pay | Admitting: Physician Assistant

## 2019-02-27 ENCOUNTER — Encounter: Payer: BLUE CROSS/BLUE SHIELD | Admitting: Adult Health Nurse Practitioner

## 2019-02-27 VITALS — BP 145/82 | HR 89 | Wt 168.0 lb

## 2019-02-27 DIAGNOSIS — R079 Chest pain, unspecified: Secondary | ICD-10-CM | POA: Diagnosis not present

## 2019-02-27 DIAGNOSIS — R0789 Other chest pain: Secondary | ICD-10-CM

## 2019-02-27 NOTE — Progress Notes (Signed)
VO:HCSPZ with patient and she states that she had episodes of chest pain this week. Patient states she not having chest pain today and just has has some questions for the provider.

## 2019-02-27 NOTE — Progress Notes (Signed)
Established Patient Office Visit  Subjective:  Patient ID: Molly Gibbs, female    DOB: 11-29-1990  Age: 28 y.o. MRN: [id number]  CC: No chief complaint on file.   HPI [name] presents for chest pain.    She reports 3 episodes of chest pain this week that came on out of the blue.  Chest pain was focal into the left chest and lasted about 3 minutes each time.  There was radiation into the shoulder and once into her tooth.  Her arm felt numb during one of the episodes.  No associated SOB.  Patient denies any current anxiety.  No depression symptoms.  Reports she feels mentally and emotionally better than she has had in awhile.  She is still smoking.  Started at the beginning of the month smoking 1ppd.  Now, smoking 5 cigarettes a day.  She is trying to quit.    Past Medical History:  Diagnosis Date  . Anxiety   . Asthma   . Blood transfusion without reported diagnosis   . Depression   . GERD (gastroesophageal reflux disease)     Past Surgical History:  Procedure Laterality Date  . CESAREAN SECTION    . DILATION AND EVACUATION N/A 10/29/2012   Procedure: DILATATION AND EVACUATION WITH ULTRASOUND;  Surgeon: Floyce Stakes. Pamala Hurry, MD;  Location: [facility];  Service: Gynecology;  Laterality: N/A;  CHROMOSOME STUDIES;POSSIBLE MOLAR PREGNANCY.   Bladder instillation performed    Family History  Problem Relation Age of Onset  . Arthritis Maternal Grandmother        Rheumatoid arthritis  . Cancer Maternal Grandmother        breast  . Arthritis Maternal Grandfather   . Arthritis Paternal Grandmother        Osteoarthritis  . Cancer Paternal Grandfather        lung  . Arthritis Paternal Grandfather        Osteoarthritis    Social History   Socioeconomic History  . Marital status: Married    Spouse name: Not on file  . Number of children: 1  . Years of education: College  . Highest education level: Not on file  Occupational History    Employer: [employer]:  [employer]  . Financial resource strain: Not on file  . Food insecurity    Worry: Not on file    Inability: Not on file  . Transportation needs    Medical: Not on file    Non-medical: Not on file  Tobacco Use  . Smoking status: Current Every Day Smoker    Packs/day: 1.00    Years: 4.00    Pack years: 4.00    Types: Cigarettes  . Smokeless tobacco: Never Used  Substance and Sexual Activity  . Alcohol use: Yes    Comment: occasionally 1 drink weekly  . Drug use: No  . Sexual activity: Yes    Birth control/protection: Inserts  Lifestyle  . Physical activity    Days per week: Not on file    Minutes per session: Not on file  . Stress: Not on file  Relationships  . Social Herbalist on phone: Not on file    Gets together: Not on file    Attends religious service: Not on file    Active member of club or organization: Not on file    Attends meetings of clubs or organizations: Not on file    Relationship status: Not on file  .  Intimate partner violence    Fear of current or ex partner: Not on file    Emotionally abused: Not on file    Physically abused: Not on file    Forced sexual activity: Not on file  Other Topics Concern  . Not on file  Social History Narrative   Patient lives at home with her daughter.   Caffeine Use: 2 sodas daily    Outpatient Medications Prior to Visit  Medication Sig Dispense Refill  . Aspirin-Acetaminophen-Caffeine (EXCEDRIN MIGRAINE PO) Take 1 tablet by mouth daily.    Marland Kitchen EPINEPHrine (EPIPEN) 0.3 mg/0.3 mL SOAJ injection Inject 0.3 mLs (0.3 mg total) into the muscle once. 2 Device 3  . escitalopram (LEXAPRO) 10 MG tablet Take 1 tablet (10 mg total) by mouth daily. 30 tablet 0  . ibuprofen (ADVIL,MOTRIN) 200 MG tablet Take 400 mg by mouth every 6 (six) hours as needed for pain (for migraines).    . nicotine (NICODERM CQ - DOSED IN MG/24 HOURS) 14 mg/24hr patch Place 1 patch (14 mg total) onto the skin daily. 14 patch  0  . nicotine (NICODERM CQ - DOSED IN MG/24 HOURS) 21 mg/24hr patch Place 1 patch (21 mg total) onto the skin daily. 21 patch 1  . nicotine (NICODERM CQ - DOSED IN MG/24 HR) 7 mg/24hr patch Place 1 patch (7 mg total) onto the skin daily. 14 patch 0   No facility-administered medications prior to visit.     Allergies  Allergen Reactions  . Codeine     swelling  . Imitrex [Sumatriptan] Swelling    tongue    ROS Review of Systems  Constitutional: Negative for chills, diaphoresis and fatigue.  Respiratory: Positive for chest tightness. Negative for shortness of breath.   Cardiovascular: Positive for chest pain. Negative for palpitations.  Endocrine: Negative.   Psychiatric/Behavioral: Negative.       Objective:    Physical Exam    GEN: NAD, , Alert & Oriented x 3  PSYCH: Normally interactive. Conversant. Not depressed or anxious appearing.  Calm demeanor.    BP (!) 145/82 Comment: done on wed.  Pulse 89 Comment: done on wed.  Wt 168 lb (76.2 kg)   BMI 30.14 kg/m  Wt Readings from Last 3 Encounters:  02/27/19 168 lb (76.2 kg)  10/07/17 165 lb 12.8 oz (75.2 kg)  10/02/17 164 lb 12.8 oz (74.8 kg)     Health Maintenance Due  Topic Date Due  . HIV Screening  02/12/2004  . TETANUS/TDAP  02/12/2008  . PAP SMEAR-Modifier  05/28/2012  . INFLUENZA VACCINE  12/27/2018    There are no preventive care reminders to display for this patient.  Lab Results  Component Value Date   TSH 1.620 10/02/2017   Lab Results  Component Value Date   WBC 6.8 10/02/2017   HGB 14.0 10/02/2017   HCT 40.4 10/02/2017   MCV 89 10/02/2017   PLT 263 10/02/2017   Lab Results  Component Value Date   NA 139 10/02/2017   K 4.4 10/02/2017   CO2 22 10/02/2017   GLUCOSE 82 10/02/2017   BUN 7 10/02/2017   CREATININE 0.65 10/02/2017   BILITOT 0.8 10/02/2017   ALKPHOS 79 10/02/2017   AST 16 10/02/2017   ALT 15 10/02/2017   PROT 7.4 10/02/2017   ALBUMIN 4.9 10/02/2017   CALCIUM 9.8  10/02/2017   No results found for: CHOL No results found for: HDL No results found for: LDLCALC No results found for: TRIG No results  found for: CHOLHDL No results found for: [id number]    Assessment & Plan:   Problem List Items Addressed This Visit    None      No orders of the defined types were placed in this encounter.  Reviewed symptoms with the patient.  Will refer to cardiology.  Patient advised to go to the ER if she has another episode of chest pain.  She verbalized understanding.  Encouraged to continue tobacco cessation.    Follow-up: No follow-ups on file.    Elyse JarvisSarah A , NP

## 2019-03-02 ENCOUNTER — Telehealth: Payer: Self-pay | Admitting: Adult Health Nurse Practitioner

## 2019-03-02 NOTE — Telephone Encounter (Signed)
Pt called with worsening symptoms and asked to call in and follow up with Sarah. Please advise on her next steps

## 2019-03-03 NOTE — Telephone Encounter (Signed)
Left message to call back to follow up on her symptoms.

## 2019-03-05 ENCOUNTER — Other Ambulatory Visit: Payer: Self-pay

## 2019-03-05 ENCOUNTER — Ambulatory Visit (INDEPENDENT_AMBULATORY_CARE_PROVIDER_SITE_OTHER): Payer: BC Managed Care – PPO | Admitting: Cardiology

## 2019-03-05 ENCOUNTER — Encounter: Payer: Self-pay | Admitting: Cardiology

## 2019-03-05 VITALS — BP 120/88 | HR 99 | Ht 62.6 in | Wt 165.0 lb

## 2019-03-05 DIAGNOSIS — R5383 Other fatigue: Secondary | ICD-10-CM

## 2019-03-05 DIAGNOSIS — R0602 Shortness of breath: Secondary | ICD-10-CM

## 2019-03-05 DIAGNOSIS — R079 Chest pain, unspecified: Secondary | ICD-10-CM

## 2019-03-05 MED ORDER — METOPROLOL TARTRATE 100 MG PO TABS: ORAL_TABLET | ORAL | 0 refills | Status: DC

## 2019-03-05 MED ORDER — NITROGLYCERIN 0.4 MG SL SUBL: 0.4000 mg | SUBLINGUAL_TABLET | SUBLINGUAL | 3 refills | Status: DC | PRN

## 2019-03-05 NOTE — Patient Instructions (Addendum)
Medication Instructions:  Your physician recommends that you continue on your current medications as directed. Please refer to the Current Medication list given to you today.  If you need a refill on your cardiac medications before your next appointment, please call your pharmacy.   Lab work: Your physician recommends that you return for lab work in today: BMET Magnesium Lipids  If you have labs (blood work) drawn today and your tests are completely normal, you will receive your results only by: Marland Kitchen MyChart Message (if you have MyChart) OR . A paper copy in the mail If you have any lab test that is abnormal or we need to change your treatment, we will call you to review the results.  Testing/Procedures: Your physician has requested that you have cardiac CT. Cardiac computed tomography (CT) is a painless test that uses an x-ray machine to take clear, detailed pictures of your heart. For further information please visit [email or web address]. Please follow instruction sheet as given.  Your physician has requested that you have an echocardiogram. Echocardiography is a painless test that uses sound waves to create images of your heart. It provides your doctor with information about the size and shape of your heart and how well your heart's chambers and valves are working. This procedure takes approximately one hour. There are no restrictions for this procedure.    Follow-Up: At [facility], you and your health needs are our priority.  As part of our continuing mission to provide you with exceptional heart care, we have created designated Provider Care Teams.  These Care Teams include your primary Cardiologist (physician) and Advanced Practice Providers (APPs -  Physician Assistants and Nurse Practitioners) who all work together to provide you with the care you need, when you need it.  Follow-up with Dr. Harriet Masson in 1 month  Any Other Special Instructions Will Be Listed Below (If  Applicable).  Your cardiac CT will be scheduled at one of the below locations:   [facility] [address] [place], [place] [address] [phone]  If scheduled at [facility], please arrive at the [facility] main entrance of [facility] 30-45 minutes prior to test start time. Proceed to the [facility] Radiology Department (first floor) to check-in and test prep.  Please follow these instructions carefully (unless otherwise directed):   On the Night Before the Test: . Be sure to Drink plenty of water. . Do not consume any caffeinated/decaffeinated beverages or chocolate 12 hours prior to your test. . Do not take any antihistamines 12 hours prior to your test.  On the Day of the Test: . Drink plenty of water. Do not drink any water within one hour of the test. . Do not eat any food 4 hours prior to the test. . You may take your regular medications prior to the test.  . Check heart rate 2 hours prior to test. If heart rate is greater than 55,Take metoprolol (Lopressor) 100 mg (One tablet) two hours prior to test. . FEMALES- please wear underwire-free bra if available      After the Test: . Drink plenty of water. . After receiving IV contrast, you may experience a mild flushed feeling. This is normal. . On occasion, you may experience a mild rash up to 24 hours after the test. This is not dangerous. If this occurs, you can take Benadryl 25 mg and increase your fluid intake. . If you experience trouble breathing, this can be serious. If it is severe call  [phone] IMMEDIATELY. If it is mild, please call our office    Please contact the cardiac imaging nurse navigator should you have any questions/concerns Rockwell Alexandria, RN Navigator Cardiac Imaging [facility] [phone] Office  [phone] Cell     Echocardiogram An echocardiogram is a procedure that uses painless sound waves (ultrasound) to produce an image of the heart.  Images from an echocardiogram can provide important information about:  Signs of coronary artery disease (CAD).  Aneurysm detection. An aneurysm is a weak or damaged part of an artery wall that bulges out from the normal force of blood pumping through the body.  Heart size and shape. Changes in the size or shape of the heart can be associated with certain conditions, including heart failure, aneurysm, and CAD.  Heart muscle function.  Heart valve function.  Signs of a past heart attack.  Fluid buildup around the heart.  Thickening of the heart muscle.  A tumor or infectious growth around the heart valves. Tell a health care provider about:  Any allergies you have.  All medicines you are taking, including vitamins, herbs, eye drops, creams, and over-the-counter medicines.  Any blood disorders you have.  Any surgeries you have had.  Any medical conditions you have.  Whether you are pregnant or may be pregnant. What are the risks? Generally, this is a safe procedure. However, problems may occur, including:  Allergic reaction to dye (contrast) that may be used during the procedure. What happens before the procedure? No specific preparation is needed. You may eat and drink normally. What happens during the procedure?   An IV tube may be inserted into one of your veins.  You may receive contrast through this tube. A contrast is an injection that improves the quality of the pictures from your heart.  A gel will be applied to your chest.  A wand-like tool (transducer) will be moved over your chest. The gel will help to transmit the sound waves from the transducer.  The sound waves will harmlessly bounce off of your heart to allow the heart images to be captured in real-time motion. The images will be recorded on a computer. The procedure may vary among health care providers and hospitals. What happens after the procedure?  You may return to your normal, everyday life,  including diet, activities, and medicines, unless your health care provider tells you not to do that. Summary  An echocardiogram is a procedure that uses painless sound waves (ultrasound) to produce an image of the heart.  Images from an echocardiogram can provide important information about the size and shape of your heart, heart muscle function, heart valve function, and fluid buildup around your heart.  You do not need to do anything to prepare before this procedure. You may eat and drink normally.  After the echocardiogram is completed, you may return to your normal, everyday life, unless your health care provider tells you not to do that. This information is not intended to replace advice given to you by your health care provider. Make sure you discuss any questions you have with your health care provider. Document Released: 05/11/2000 Document Revised: 09/04/2018 Document Reviewed: 06/16/2016 Elsevier Patient Education  2020 [employer].

## 2019-03-05 NOTE — Progress Notes (Signed)
Cardiology Office Note:    Date:  03/05/2019   ID:  Molly Gibbs, DOB [birth year], MRN [id number]  PCP:  Magdalene River, PA-C  Cardiologist:  No primary care provider on file.  Electrophysiologist:  None   Referring MD: Royal Hawthorn, NP   Chief Complaint  Patient presents with  . Chest Pain    onset 8 days    History of Present Illness:    Molly Gibbs is a 28 y.o. female with a hx of GERD, asthma, current smoker and family history of premature coronary artery disease presents to be evaluated for pain.  She reports over the last 2 weeks she has been experiencing intermittent left-sided chest pain.  Notes that he feels more as a pressure-like feeling the last for 5 to 10 minutes minutes when it comes on.  She says at times is heavy left-sided which radiates to her jaw as well as her left arm.  She denies any numbness or tingling.  Admits to associated shortness of breath.  In the last week she states that is been occurring more frequently now.  She tells me the pain also gets worse when she smoking therefore she is cut back significantly on her smoking.  It resolves with time.  In addition she mentions that she is been having significant fatigue especially after her chest pain episodes.   Past Medical History:  Diagnosis Date  . Anxiety   . Asthma   . Blood transfusion without reported diagnosis   . Depression   . GERD (gastroesophageal reflux disease)     Past Surgical History:  Procedure Laterality Date  . CESAREAN SECTION    . DILATION AND EVACUATION N/A 10/29/2012   Procedure: DILATATION AND EVACUATION WITH ULTRASOUND;  Surgeon: Alphonsus Sias. Ernestina Penna, MD;  Location: [facility];  Service: Gynecology;  Laterality: N/A;  CHROMOSOME STUDIES;POSSIBLE MOLAR PREGNANCY.   Bladder instillation performed    Current Medications: Current Meds  Medication Sig  . Aspirin-Acetaminophen-Caffeine (EXCEDRIN MIGRAINE PO) Take 1 tablet by mouth daily.  Marland Kitchen EPINEPHrine (EPIPEN) 0.3  mg/0.3 mL SOAJ injection Inject 0.3 mLs (0.3 mg total) into the muscle once.  Marland Kitchen ibuprofen (ADVIL,MOTRIN) 200 MG tablet Take 400 mg by mouth every 6 (six) hours as needed for pain (for migraines).     Allergies:   Codeine and Imitrex [sumatriptan]   Social History   Socioeconomic History  . Marital status: Married    Spouse name: Not on file  . Number of children: 1  . Years of education: College  . Highest education level: Not on file  Occupational History    Employer: [employer]    Comment: [employer]  Social Needs  . Financial resource strain: Not on file  . Food insecurity    Worry: Not on file    Inability: Not on file  . Transportation needs    Medical: Not on file    Non-medical: Not on file  Tobacco Use  . Smoking status: Current Every Day Smoker    Packs/day: 1.00    Years: 4.00    Pack years: 4.00    Types: Cigarettes  . Smokeless tobacco: Never Used  Substance and Sexual Activity  . Alcohol use: Yes    Comment: occasionally 1 drink weekly  . Drug use: No  . Sexual activity: Yes    Birth control/protection: Inserts  Lifestyle  . Physical activity    Days per week: Not on file    Minutes per session: Not on file  .  Stress: Not on file  Relationships  . Social Musician on phone: Not on file    Gets together: Not on file    Attends religious service: Not on file    Active member of club or organization: Not on file    Attends meetings of clubs or organizations: Not on file    Relationship status: Not on file  Other Topics Concern  . Not on file  Social History Narrative   Patient lives at home with her daughter.   Caffeine Use: 2 sodas daily     Family History: The patient's family history includes Arthritis in her maternal grandfather, maternal grandmother, paternal grandfather, and paternal grandmother; Cancer in her maternal grandmother and paternal grandfather.  Her grandmother first microinfarction was at age 3.  ROS:    Review of Systems  Constitution: Negative for decreased appetite, fever and weight gain.  HENT: Negative for congestion, ear discharge, hoarse voice and sore throat.   Eyes: Negative for discharge, redness, vision loss in right eye and visual halos.  Cardiovascular: She reports chest pain and shortness of breath.  Negative for leg swelling, orthopnea and palpitations.  Respiratory: Negative for cough, hemoptysis, shortness of breath and snoring.   Endocrine: Negative for heat intolerance and polyphagia.  Hematologic/Lymphatic: Negative for bleeding problem. Does not bruise/bleed easily.  Skin: Negative for flushing, nail changes, rash and suspicious lesions.  Musculoskeletal: Negative for arthritis, joint pain, muscle cramps, myalgias, neck pain and stiffness.  Gastrointestinal: Negative for abdominal pain, bowel incontinence, diarrhea and excessive appetite.  Genitourinary: Negative for decreased libido, genital sores and incomplete emptying.  Neurological: Negative for brief paralysis, focal weakness, headaches and loss of balance.  Psychiatric/Behavioral: Negative for altered mental status, depression and suicidal ideas.  Allergic/Immunologic: Negative for HIV exposure and persistent infections.    EKGs/Labs/Other Studies Reviewed:    The following studies were reviewed today:   EKG:  The ekg ordered today demonstrates sinus rhythm, heart rate 95 bpm, impaired to EKG in April 2016 showed evidence of sinus rhythm heart rate 88 beats minute with arrhythmia.  Recent Labs: No results found for requested labs within last 8760 hours.  Recent Lipid Panel No results found for: CHOL, TRIG, HDL, CHOLHDL, VLDL, LDLCALC, LDLDIRECT  Physical Exam:    VS:  BP 120/88 (BP Location: Left Arm, Patient Position: Sitting, Cuff Size: Normal)   Pulse 99   Ht 5' 2.6" (1.59 m)   Wt 165 lb (74.8 kg)   SpO2 99%   BMI 29.60 kg/m     Wt Readings from Last 3 Encounters:  03/05/19 165 lb (74.8 kg)   02/27/19 168 lb (76.2 kg)  10/07/17 165 lb 12.8 oz (75.2 kg)     GEN: Well nourished, well developed in no acute distress HEENT: Normal NECK: No JVD; No carotid bruits LYMPHATICS: No lymphadenopathy CARDIAC: S1S2 noted,RRR, no murmurs, rubs, gallops RESPIRATORY:  Clear to auscultation without rales, wheezing or rhonchi  ABDOMEN: Soft, non-tender, non-distended, +bowel sounds, no guarding. EXTREMITIES: No edema, No cyanosis, no clubbing MUSCULOSKELETAL:  No edema; No deformity  SKIN: Warm and dry NEUROLOGIC:  Alert and oriented x 3, non-focal PSYCHIATRIC:  Normal affect, good insight  ASSESSMENT:    1. Chest pain, unspecified type   2. Shortness of breath   3. Fatigue, unspecified type    PLAN:    1.  Chest pain is concerning given her family history as well as she is a smoker.  At this time it is appropriate  to pursue a CTA coronary stress test for any coronary artery disease.  The patient does not have any contrast allergy, she was educated on his testing and she agrees to proceed.   2.  Sublingual nitroglycerin prescription was sent, its protocol and 911 protocol explained and the patient vocalized understanding questions were answered to the patient's satisfaction   3.  Due to shortness of breath and generalized fatigue a transthoracic echocardiogram has been ordered to assess her right and left side function.  This will also allow us to appreciate any valvular abnormalities.  4.  I did speak with patient that she should also be screened for obstructive sleep apnea and she will discuss this with her primary care doctor.  5.  She does need a fasting lipid panel.  She is going to come back at a later date to get this testing done.  The patient was counseled on tobacco cessation today for 5 minutes.  Counseling included reviewing the risks of smoking tobacco products, how it impacts the patient's current medical diagnoses and different strategies for quitting.  Pharmacotherapy to  aid in tobacco cessation was not prescribed today. The patient coordinate with  primary care provider.  The patient was also advised to call  1-800-QUIT-NOW [phone]) for additional help with quitting smoking.  The patient is in agreement with the above plan. The patient left the office in stable condition.  The patient will follow up in 1 month.   Medication Adjustments/Labs and Tests Ordered: Current medicines are reviewed at length with the patient today.  Concerns regarding medicines are outlined above.  Orders Placed This Encounter  Procedures  . CT CORONARY MORPH W/CTA COR W/SCORE W/CA W/CM &/OR WO/CM  . CT CORONARY FRACTIONAL FLOW RESERVE DATA PREP  . CT CORONARY FRACTIONAL FLOW RESERVE FLUID ANALYSIS  . Basic Metabolic Panel (BMET)  . Lipid Profile  . Magnesium  . EKG 12-Lead  . ECHOCARDIOGRAM COMPLETE   Meds ordered this encounter  Medications  . metoprolol tartrate (LOPRESSOR) 100 MG tablet    Sig: Take 1 tablet (100 mg) 2 hours prior to test if heart rate greater than 55.    Dispense:  1 tablet    Refill:  0  . nitroGLYCERIN (NITROSTAT) 0.4 MG SL tablet    Sig: Place 1 tablet (0.4 mg total) under the tongue every 5 (five) minutes as needed for chest pain.    Dispense:  90 tablet    Refill:  3    Patient Instructions  Medication Instructions:  Your physician recommends that you continue on your current medications as directed. Please refer to the Current Medication list given to you today.  If you need a refill on your cardiac medications before your next appointment, please call your pharmacy.   Lab work: Your physician recommends that you return for lab work in today: BMET Magnesium Lipids  If you have labs (blood work) drawn today and your tests are completely normal, you will receive your results only by: Marland Kitchen. MyChart Message (if you have MyChart) OR . A paper copy in the mail If you have any lab test that is abnormal or we need to change your treatment,  we will call you to review the results.  Testing/Procedures: Your physician has requested that you have cardiac CT. Cardiac computed tomography (CT) is a painless test that uses an x-ray machine to take clear, detailed pictures of your heart. For further information please visit [email or web address]. Please follow instruction sheet as given.  Your physician  has requested that you have an echocardiogram. Echocardiography is a painless test that uses sound waves to create images of your heart. It provides your doctor with information about the size and shape of your heart and how well your heart's chambers and valves are working. This procedure takes approximately one hour. There are no restrictions for this procedure.    Follow-Up: At [facility], you and your health needs are our priority.  As part of our continuing mission to provide you with exceptional heart care, we have created designated Provider Care Teams.  These Care Teams include your primary Cardiologist (physician) and Advanced Practice Providers (APPs -  Physician Assistants and Nurse Practitioners) who all work together to provide you with the care you need, when you need it.  Follow-up with Dr. Servando Salina in 1 month  Any Other Special Instructions Will Be Listed Below (If Applicable).  Your cardiac CT will be scheduled at one of the below locations:   [facility] [address] [place], [place] [address] [phone]  If scheduled at [facility], please arrive at the [facility] main entrance of [facility] 30-45 minutes prior to test start time. Proceed to the [facility] Radiology Department (first floor) to check-in and test prep.  Please follow these instructions carefully (unless otherwise directed):   On the Night Before the Test: . Be sure to Drink plenty of water. . Do not consume any caffeinated/decaffeinated beverages or chocolate 12 hours prior to your test. . Do not take any  antihistamines 12 hours prior to your test.  On the Day of the Test: . Drink plenty of water. Do not drink any water within one hour of the test. . Do not eat any food 4 hours prior to the test. . You may take your regular medications prior to the test.  . Check heart rate 2 hours prior to test. If heart rate is greater than 55,Take metoprolol (Lopressor) 100 mg (One tablet) two hours prior to test. . FEMALES- please wear underwire-free bra if available      After the Test: . Drink plenty of water. . After receiving IV contrast, you may experience a mild flushed feeling. This is normal. . On occasion, you may experience a mild rash up to 24 hours after the test. This is not dangerous. If this occurs, you can take Benadryl 25 mg and increase your fluid intake. . If you experience trouble breathing, this can be serious. If it is severe call 911 IMMEDIATELY. If it is mild, please call our office    Please contact the cardiac imaging nurse navigator should you have any questions/concerns Rockwell Alexandria, RN Navigator Cardiac Imaging [facility] [phone] Office  [phone] Cell     Echocardiogram An echocardiogram is a procedure that uses painless sound waves (ultrasound) to produce an image of the heart. Images from an echocardiogram can provide important information about:  Signs of coronary artery disease (CAD).  Aneurysm detection. An aneurysm is a weak or damaged part of an artery wall that bulges out from the normal force of blood pumping through the body.  Heart size and shape. Changes in the size or shape of the heart can be associated with certain conditions, including heart failure, aneurysm, and CAD.  Heart muscle function.  Heart valve function.  Signs of a past heart attack.  Fluid buildup around the heart.  Thickening of the heart muscle.  A tumor or infectious growth around the  heart valves. Tell a health care provider about:  Any  allergies you have.  All medicines you are taking, including vitamins, herbs, eye drops, creams, and over-the-counter medicines.  Any blood disorders you have.  Any surgeries you have had.  Any medical conditions you have.  Whether you are pregnant or may be pregnant. What are the risks? Generally, this is a safe procedure. However, problems may occur, including:  Allergic reaction to dye (contrast) that may be used during the procedure. What happens before the procedure? No specific preparation is needed. You may eat and drink normally. What happens during the procedure?   An IV tube may be inserted into one of your veins.  You may receive contrast through this tube. A contrast is an injection that improves the quality of the pictures from your heart.  A gel will be applied to your chest.  A wand-like tool (transducer) will be moved over your chest. The gel will help to transmit the sound waves from the transducer.  The sound waves will harmlessly bounce off of your heart to allow the heart images to be captured in real-time motion. The images will be recorded on a computer. The procedure may vary among health care providers and hospitals. What happens after the procedure?  You may return to your normal, everyday life, including diet, activities, and medicines, unless your health care provider tells you not to do that. Summary  An echocardiogram is a procedure that uses painless sound waves (ultrasound) to produce an image of the heart.  Images from an echocardiogram can provide important information about the size and shape of your heart, heart muscle function, heart valve function, and fluid buildup around your heart.  You do not need to do anything to prepare before this procedure. You may eat and drink normally.  After the echocardiogram is completed, you may return to your normal, everyday life, unless your health care provider tells you not to do that. This  information is not intended to replace advice given to you by your health care provider. Make sure you discuss any questions you have with your health care provider. Document Released: 05/11/2000 Document Revised: 09/04/2018 Document Reviewed: 06/16/2016 Elsevier Patient Education  2020 [employer].     Adopting a Healthy Lifestyle.  Know what a healthy weight is for you (roughly BMI <25) and aim to maintain this   Aim for 7+ servings of fruits and vegetables daily   65-80+ fluid ounces of water or unsweet tea for healthy kidneys   Limit to max 1 drink of alcohol per day; avoid smoking/tobacco   Limit animal fats in diet for cholesterol and heart health - choose grass fed whenever available   Avoid highly processed foods, and foods high in saturated/trans fats   Aim for low stress - take time to unwind and care for your mental health   Aim for 150 min of moderate intensity exercise weekly for heart health, and weights twice weekly for bone health   Aim for 7-9 hours of sleep daily   When it comes to diets, agreement about the perfect plan isnt easy to find, even among the experts. Experts at the [facility] [employer] developed an idea known as the Healthy Eating Plate. Just imagine a plate divided into logical, healthy portions.   The emphasis is on diet quality:   Load up on vegetables and fruits - one-half of your plate: Aim for color and variety, and remember that potatoes dont count.  Go for whole grains - one-quarter of your plate: Whole wheat, barley, wheat berries, quinoa, oats, brown rice, and foods made with them. If you want pasta, go with whole wheat pasta.   Protein power - one-quarter of your plate: Fish, chicken, beans, and nuts are all healthy, versatile protein sources. Limit red meat.   The diet, however, does go beyond the plate, offering a few other suggestions.   Use healthy plant oils, such as olive, canola, soy, corn, sunflower and peanut.  Check the labels, and avoid partially hydrogenated oil, which have unhealthy trans fats.   If youre thirsty, drink water. Coffee and tea are good in moderation, but skip sugary drinks and limit milk and dairy products to one or two daily servings.   The type of carbohydrate in the diet is more important than the amount. Some sources of carbohydrates, such as vegetables, fruits, whole grains, and beans-are healthier than others.   Finally, stay active  Signed, Berniece Salines, DO  03/05/2019 1:41 PM     [facility]

## 2019-03-06 ENCOUNTER — Other Ambulatory Visit: Payer: Self-pay

## 2019-03-06 ENCOUNTER — Ambulatory Visit (HOSPITAL_BASED_OUTPATIENT_CLINIC_OR_DEPARTMENT_OTHER)
Admission: RE | Admit: 2019-03-06 | Discharge: 2019-03-06 | Disposition: A | Discharge status: Home / Self Care | Payer: BC Managed Care – PPO | Source: Ambulatory Visit | Attending: Cardiology | Admitting: Cardiology

## 2019-03-06 DIAGNOSIS — R079 Chest pain, unspecified: Secondary | ICD-10-CM | POA: Diagnosis not present

## 2019-03-06 LAB — BASIC METABOLIC PANEL
BUN/Creatinine Ratio: 16 (ref 9–23)
BUN: 10 mg/dL (ref 6–20)
CO2: 25 mmol/L (ref 20–29)
Calcium: 9.8 mg/dL (ref 8.7–10.2)
Chloride: 103 mmol/L (ref 96–106)
Creatinine, Ser: 0.63 mg/dL (ref 0.57–1.00)
GFR calc Af Amer: 139 mL/min/{1.73_m2} (ref >59)
GFR calc non Af Amer: 121 mL/min/{1.73_m2} (ref >59)
Glucose: 86 mg/dL (ref 65–99)
Potassium: 4.3 mmol/L (ref 3.5–5.2)
Sodium: 140 mmol/L (ref 134–144)

## 2019-03-06 LAB — LIPID PANEL
Chol/HDL Ratio: 4.8 ratio — ABNORMAL HIGH (ref 0.0–4.4)
Cholesterol, Total: 159 mg/dL (ref 100–199)
HDL: 33 mg/dL — ABNORMAL LOW (ref >39)
LDL Chol Calc (NIH): 113 mg/dL — ABNORMAL HIGH (ref 0–99)
Triglycerides: 66 mg/dL (ref 0–149)
VLDL Cholesterol Cal: 13 mg/dL (ref 5–40)

## 2019-03-06 LAB — MAGNESIUM: Magnesium: 2.1 mg/dL (ref 1.6–2.3)

## 2019-03-09 ENCOUNTER — Telehealth: Payer: Self-pay | Admitting: Cardiology

## 2019-03-09 ENCOUNTER — Telehealth: Payer: Self-pay | Admitting: *Deleted

## 2019-03-09 NOTE — Telephone Encounter (Signed)
-----   Message from Berniece Salines, DO sent at 03/09/2019 10:12 AM EDT ----- Patient labs and echo are normal.  Left her questions about this call to make sure she got the report.

## 2019-03-09 NOTE — Telephone Encounter (Signed)
Telephone call to patient. Asking since echo was normal does she still need the cardiacCTa. Informed her Dr Harriet Masson still wants her to follow through with tthe CT. Patient verbalized understanding.

## 2019-03-09 NOTE — Telephone Encounter (Signed)
Dr. Harriet Masson called with results on Saturday and someone today. Please call her

## 2019-03-09 NOTE — Telephone Encounter (Signed)
Telephone call to patient. Left message that labs and echo were normal and to call with any questions. 

## 2019-03-10 NOTE — Telephone Encounter (Signed)
Pt has been evaluated by her Cardio doctor  for her symptoms.

## 2019-03-12 DIAGNOSIS — F4323 Adjustment disorder with mixed anxiety and depressed mood: Secondary | ICD-10-CM | POA: Diagnosis not present

## 2019-04-09 ENCOUNTER — Other Ambulatory Visit: Payer: Self-pay

## 2019-04-09 ENCOUNTER — Encounter: Payer: Self-pay | Admitting: Cardiology

## 2019-04-09 ENCOUNTER — Ambulatory Visit (INDEPENDENT_AMBULATORY_CARE_PROVIDER_SITE_OTHER): Payer: BC Managed Care – PPO | Admitting: Cardiology

## 2019-04-09 VITALS — BP 112/70 | HR 85 | Ht 62.6 in | Wt 171.0 lb

## 2019-04-09 DIAGNOSIS — R002 Palpitations: Secondary | ICD-10-CM

## 2019-04-09 DIAGNOSIS — E785 Hyperlipidemia, unspecified: Secondary | ICD-10-CM

## 2019-04-09 DIAGNOSIS — R079 Chest pain, unspecified: Secondary | ICD-10-CM | POA: Insufficient documentation

## 2019-04-09 DIAGNOSIS — Z720 Tobacco use: Secondary | ICD-10-CM | POA: Diagnosis not present

## 2019-04-09 DIAGNOSIS — E669 Obesity, unspecified: Secondary | ICD-10-CM | POA: Insufficient documentation

## 2019-04-09 DIAGNOSIS — F4323 Adjustment disorder with mixed anxiety and depressed mood: Secondary | ICD-10-CM | POA: Diagnosis not present

## 2019-04-09 NOTE — Progress Notes (Signed)
Cardiology Office Note:    Date:  04/09/2019   ID:  [name], DOB 1990-11-06, MRN [id number]  PCP:  Leonie Douglas, PA-C  Cardiologist:  Berniece Salines, DO  Electrophysiologist:  None   Referring MD: Leonie Douglas, PA*   Chief Complaint  Patient presents with  . Follow-up    History of Present Illness:    Molly Gibbs is a 28 y.o. female with a hx of anxiety, GERD, depression significant family history of coronary disease presented on March 05, 2019 at which time she reported chest pain.  Given the characteristics of the chest pain which this was concerning therefore CTA coronary was recommended.  Today she tells me that she has been experiencing some intermittent chest pain.  She describes it as left-sided which lasts about 10 minutes and radiates to her left arm.  She has required nitroglycerin twice now.  She notes that the first time this happened she was mowing her lawn with a push lawnmower when she experienced sniffing and chest pain.  She had to stop and took her nitroglycerin which helped relieve the sensation.  Second episode was when she was doing a lot of activities on her day off and on the end of it again experienced this sided chest heaviness which she took a nitroglycerin for and this resolved.  She admits to associated shortness of breath.  And with the sensation she experiencing palpitations.  She states that the palpitations are rapid onset and rapid onset.  She notes that it lasts for seconds to minutes at a time.    Past Medical History:  Diagnosis Date  . Anxiety   . Asthma   . Blood transfusion without reported diagnosis   . Depression   . GERD (gastroesophageal reflux disease)     Past Surgical History:  Procedure Laterality Date  . CESAREAN SECTION    . DILATION AND EVACUATION N/A 10/29/2012   Procedure: DILATATION AND EVACUATION WITH ULTRASOUND;  Surgeon: Floyce Stakes. Pamala Hurry, MD;  Location: [facility];  Service: Gynecology;  Laterality: N/A;   CHROMOSOME STUDIES;POSSIBLE MOLAR PREGNANCY.   Bladder instillation performed    Current Medications: Current Meds  Medication Sig  . Aspirin-Acetaminophen-Caffeine (EXCEDRIN MIGRAINE PO) Take 1 tablet by mouth daily.  Marland Kitchen EPINEPHrine (EPIPEN) 0.3 mg/0.3 mL SOAJ injection Inject 0.3 mLs (0.3 mg total) into the muscle once.  Marland Kitchen ibuprofen (ADVIL,MOTRIN) 200 MG tablet Take 400 mg by mouth every 6 (six) hours as needed for pain (for migraines).  . metoprolol tartrate (LOPRESSOR) 100 MG tablet Take 1 tablet (100 mg) 2 hours prior to test if heart rate greater than 55.  . nitroGLYCERIN (NITROSTAT) 0.4 MG SL tablet Place 1 tablet (0.4 mg total) under the tongue every 5 (five) minutes as needed for chest pain.     Allergies:   Codeine and Imitrex [sumatriptan]   Social History   Socioeconomic History  . Marital status: Married    Spouse name: Not on file  . Number of children: 1  . Years of education: College  . Highest education level: Not on file  Occupational History    Employer: [employer]  . Financial resource strain: Not on file  . Food insecurity    Worry: Not on file    Inability: Not on file  . Transportation needs    Medical: Not on file    Non-medical: Not on file  Tobacco Use  . Smoking status: Current Every Day  Smoker    Packs/day: 1.00    Years: 4.00    Pack years: 4.00    Types: Cigarettes  . Smokeless tobacco: Never Used  Substance and Sexual Activity  . Alcohol use: Yes    Comment: occasionally 1 drink weekly  . Drug use: No  . Sexual activity: Yes    Birth control/protection: Inserts  Lifestyle  . Physical activity    Days per week: Not on file    Minutes per session: Not on file  . Stress: Not on file  Relationships  . Social Musician on phone: Not on file    Gets together: Not on file    Attends religious service: Not on file    Active member of club or organization: Not on file    Attends  meetings of clubs or organizations: Not on file    Relationship status: Not on file  Other Topics Concern  . Not on file  Social History Narrative   Patient lives at home with her daughter.   Caffeine Use: 2 sodas daily     Family History: The patient's family history includes Arthritis in her maternal grandfather, maternal grandmother, paternal grandfather, and paternal grandmother; Cancer in her maternal grandmother and paternal grandfather.  ROS:   Review of Systems  Constitution: Negative for decreased appetite, fever and weight gain.  HENT: Negative for congestion, ear discharge, hoarse voice and sore throat.   Eyes: Negative for discharge, redness, vision loss in right eye and visual halos.  Cardiovascular: Reports chest pain and palpitations.  Negative for dyspnea on exertion, leg swelling, orthopnea. Respiratory: Negative for cough, hemoptysis, shortness of breath and snoring.   Endocrine: Negative for heat intolerance and polyphagia.  Hematologic/Lymphatic: Negative for bleeding problem. Does not bruise/bleed easily.  Skin: Negative for flushing, nail changes, rash and suspicious lesions.  Musculoskeletal: Negative for arthritis, joint pain, muscle cramps, myalgias, neck pain and stiffness.  Gastrointestinal: Negative for abdominal pain, bowel incontinence, diarrhea and excessive appetite.  Genitourinary: Negative for decreased libido, genital sores and incomplete emptying.  Neurological: Negative for brief paralysis, focal weakness, headaches and loss of balance.  Psychiatric/Behavioral: Negative for altered mental status, depression and suicidal ideas.  Allergic/Immunologic: Negative for HIV exposure and persistent infections.   EKGs/Labs/Other Studies Reviewed:    The following studies were reviewed today:   EKG: None today  Transthoracic echocardiogram IMPRESSIONS: 03/06/2019  1. Left ventricular ejection fraction, by visual estimation, is 60 to 65%. The left  ventricle has normal function. Normal left ventricular size. There is no left ventricular hypertrophy.  2. Global right ventricle has normal systolic function.The right ventricular size is normal. No increase in right ventricular wall thickness.  3. Left atrial size was normal.  4. Right atrial size was normal.  5. The mitral valve is normal in structure. No evidence of mitral valve regurgitation. No evidence of mitral stenosis.  6. The tricuspid valve is normal in structure. Tricuspid valve regurgitation was not visualized by color flow Doppler.  7. The aortic valve is normal in structure. Aortic valve regurgitation was not visualized by color flow Doppler. Structurally normal aortic valve, with no evidence of sclerosis or stenosis.  8. The pulmonic valve was normal in structure. Pulmonic valve regurgitation is not visualized by color flow Doppler.  9. The inferior vena cava is normal in size with greater than 50% respiratory variability, suggesting right atrial pressure of 3 mmHg.  Recent Labs: 03/05/2019: BUN 10; Creatinine, Ser 0.63; Magnesium 2.1; Potassium 4.3;  Sodium 140  Recent Lipid Panel    Component Value Date/Time   CHOL 159 03/05/2019 1047   TRIG 66 03/05/2019 1047   HDL 33 (L) 03/05/2019 1047   CHOLHDL 4.8 (H) 03/05/2019 1047   LDLCALC 113 (H) 03/05/2019 1047    Physical Exam:    VS:  BP 112/70 (BP Location: Right Arm, Patient Position: Sitting, Cuff Size: Normal)   Pulse 85   Ht 5' 2.6" (1.59 m)   Wt 171 lb (77.6 kg)   SpO2 100%   BMI 30.68 kg/m     Wt Readings from Last 3 Encounters:  04/09/19 171 lb (77.6 kg)  03/05/19 165 lb (74.8 kg)  02/27/19 168 lb (76.2 kg)    GEN: Well nourished, well developed in no acute distress HEENT: Normal NECK: No JVD; No carotid bruits LYMPHATICS: No lymphadenopathy CARDIAC: S1S2 noted,RRR, no murmurs, rubs, gallops RESPIRATORY:  Clear to auscultation without rales, wheezing or rhonchi  ABDOMEN: Soft, non-tender, non-distended,  +bowel sounds, no guarding. EXTREMITIES: No edema, No cyanosis, no clubbing MUSCULOSKELETAL:  No edema; No deformity  SKIN: Warm and dry NEUROLOGIC:  Alert and oriented x 3, non-focal PSYCHIATRIC:  Normal affect, good insight  ASSESSMENT:    1. Chest pain of uncertain etiology   2. Palpitations   3. Tobacco use   4. Dyslipidemia (high LDL; low HDL)   5. Obesity (BMI 30-39.9)    PLAN:     1.  Chest pain-she has her CTA coronaries planned for April 15, 2019.  At which time we will review the results and recommend further diagnostic testing and treatment as necessary.  Continue on a as needed nitroglycerin for now.  He was also advised to call 911 if pain persist after taking nitroglycerin.  2.  Palpitations-we will have the patient wear a ZIO monitor for 7 days to rule out any cardiovascular etiology of this.  3.  Tobacco use-the patient was counseled on tobacco cessation today for 5 minutes.  Counseling included reviewing the risks of smoking tobacco products, how it impacts the patient's current medical diagnoses and different strategies for quitting.  Pharmacotherapy to aid in tobacco cessation was not prescribed today. The patient coordinate with  primary care provider.  The patient was also advised to call  1-800-QUIT-NOW [phone])150-5143) for additional help with quitting smoking.  4.  Obesity-the patient understands the need to lose weight with diet and exercise. We have discussed specific strategies for this.  5.  Dyslipidemia -LDL 113, HDL 33.  We will continue lifestyle modification.  The patient is in agreement with the above plan. The patient left the office in stable condition.  The patient will follow up in 3 months.   Medication Adjustments/Labs and Tests Ordered: Current medicines are reviewed at length with the patient today.  Concerns regarding medicines are outlined above.  Orders Placed This Encounter  Procedures  . Basic Metabolic Panel (BMET)  . Magnesium  .  TSH  . LONG TERM MONITOR-LIVE TELEMETRY (3-14 DAYS)   No orders of the defined types were placed in this encounter.   There are no Patient Instructions on file for this visit.   Adopting a Healthy Lifestyle.  Know what a healthy weight is for you (roughly BMI <25) and aim to maintain this   Aim for 7+ servings of fruits and vegetables daily   65-80+ fluid ounces of water or unsweet tea for healthy kidneys   Limit to max 1 drink of alcohol per day; avoid smoking/tobacco   Limit  animal fats in diet for cholesterol and heart health - choose grass fed whenever available   Avoid highly processed foods, and foods high in saturated/trans fats   Aim for low stress - take time to unwind and care for your mental health   Aim for 150 min of moderate intensity exercise weekly for heart health, and weights twice weekly for bone health   Aim for 7-9 hours of sleep daily   When it comes to diets, agreement about the perfect plan isnt easy to find, even among the experts. Experts at the [employer] developed an idea known as the Healthy Eating Plate. Just imagine a plate divided into logical, healthy portions.   The emphasis is on diet quality:   Load up on vegetables and fruits - one-half of your plate: Aim for color and variety, and remember that potatoes dont count.   Go for whole grains - one-quarter of your plate: Whole wheat, barley, wheat berries, quinoa, oats, brown rice, and foods made with them. If you want pasta, go with whole wheat pasta.   Protein power - one-quarter of your plate: Fish, chicken, beans, and nuts are all healthy, versatile protein sources. Limit red meat.   The diet, however, does go beyond the plate, offering a few other suggestions.   Use healthy plant oils, such as olive, canola, soy, corn, sunflower and peanut. Check the labels, and avoid partially hydrogenated oil, which have unhealthy trans fats.   If youre thirsty, drink water.  Coffee and tea are good in moderation, but skip sugary drinks and limit milk and dairy products to one or two daily servings.   The type of carbohydrate in the diet is more important than the amount. Some sources of carbohydrates, such as vegetables, fruits, whole grains, and beans-are healthier than others.   Finally, stay active  Signed, Thomasene RippleKardie Isella Slatten, DO  04/09/2019 10:14 AM    [facility]

## 2019-04-09 NOTE — Patient Instructions (Addendum)
Medication Instructions:  Your physician recommends that you continue on your current medications as directed. Please refer to the Current Medication list given to you today.  *If you need a refill on your cardiac medications before your next appointment, please call your pharmacy*  Lab Work: Your physician recommends that you return for lab work in: TODAY  BMET, Magnesium, TSH   If you have labs (blood work) drawn today and your tests are completely normal, you will receive your results only by: Marland Kitchen MyChart Message (if you have MyChart) OR . A paper copy in the mail If you have any lab test that is abnormal or we need to change your treatment, we will call you to review the results.  Testing/Procedures: Your physician has recommended that you wear a holter monitor FOR 7 DAYS  Holter monitors are medical devices that record the heart's electrical activity. Doctors most often use these monitors to diagnose arrhythmias. Arrhythmias are problems with the speed or rhythm of the heartbeat. The monitor is a small, portable device. You can wear one while you do your normal daily activities. This is usually used to diagnose what is causing palpitations/syncope (passing out).   Follow-Up: At [facility], you and your health needs are our priority.  As part of our continuing mission to provide you with exceptional heart care, we have created designated Provider Care Teams.  These Care Teams include your primary Cardiologist (physician) and Advanced Practice Providers (APPs -  Physician Assistants and Nurse Practitioners) who all work together to provide you with the care you need, when you need it.  Your next appointment:   3 months  The format for your next appointment:   In Person  Provider:   You may see Thomasene Ripple, DO  or the following Advanced Practice Provider on your designated Care Team:    Gillian Shields, FNP   Other Instructions   Ambulatory Cardiac Monitoring An ambulatory  cardiac monitor is a small recording device that is used to detect abnormal heart rhythms (arrhythmias). Most monitors are connected by wires to flat, sticky disks (electrodes) that are then attached to your chest. You may need to wear a monitor if you have had symptoms such as:  Fast heartbeats (palpitations).  Dizziness.  Fainting or light-headedness.  Unexplained weakness.  Shortness of breath. There are several types of monitors. Some common monitors include:  Holter monitor. This records your heart rhythm continuously, usually for 24-48 hours.  Event (episodic) monitor. This monitor has a symptoms button, and when pushed, it will begin recording. You need to activate this monitor to record when you have a heart-related symptom.  Automatic detection monitor. This monitor will begin recording when it detects an abnormal heartbeat. What are the risks? Generally, these devices are safe to use. However, it is possible that the skin under the electrodes will become irritated. How to prepare for monitoring Your health care provider will prepare your chest for the electrode placement and show you how to use the monitor.  Do not apply lotions to your chest before monitoring.  Follow directions on how to care for the monitor, and how to return the monitor when the testing period is complete. How to use your cardiac monitor  Follow directions about how long to wear the monitor, and if you can take the monitor off in order to shower or bathe. ? Do not let the monitor get wet. ? Do not bathe, swim, or use a hot tub while wearing the monitor.  Keep your skin clean. Do not put body lotion or moisturizer on your chest.  Change the electrodes as told by your health care provider, or any time they stop sticking to your skin. You may need to use medical tape to keep them on.  Try to put the electrodes in slightly different places on your chest to help prevent skin irritation. Follow directions  from your health care provider about where to place the electrodes.  Make sure the monitor is safely clipped to your clothing or in a location close to your body as recommended by your health care provider.  If your monitor has a symptoms button, press the button to mark an event as soon as you feel a heart-related symptom, such as: ? Dizziness. ? Weakness. ? Light-headedness. ? Palpitations. ? Thumping or pounding in your chest. ? Shortness of breath. ? Unexplained weakness.  Keep a diary of your activities, such as walking, doing chores, and taking medicine. It is very important to note what you were doing when you pushed the button to record your symptoms. This will help your health care provider determine what might be contributing to your symptoms.  Send the recorded information as recommended by your health care provider. It may take some time for your health care provider to process the results.  Change the batteries as told by your health care provider.  Keep electronic devices away from your monitor. These include: ? Tablets. ? MP3 players. ? Cell phones.  While wearing your monitor you should avoid: ? Electric blankets. ? Armed forces operational officer. ? Electric toothbrushes. ? Microwave ovens. ? Magnets. ? Metal detectors. Get help right away if:  You have chest pain.  You have shortness of breath or extreme difficulty breathing.  You develop a very fast heartbeat that does not get better.  You develop dizziness that does not go away.  You faint or constantly feel like you are about to faint. Summary  An ambulatory cardiac monitor is a small recording device that is used to detect abnormal heart rhythms (arrhythmias).  Make sure you understand how to send the information from the monitor to your health care provider.  It is important to press the button on the monitor when you have any heart-related symptoms.  Keep a diary of your activities, such as walking, doing  chores, and taking medicine. It is very important to note what you were doing when you pushed the button to record your symptoms. This will help your health care provider learn what might be causing your symptoms. This information is not intended to replace advice given to you by your health care provider. Make sure you discuss any questions you have with your health care provider. Document Released: 02/21/2008 Document Revised: 04/26/2017 Document Reviewed: 04/28/2016 Elsevier Patient Education  2020 [employer].

## 2019-04-10 LAB — BASIC METABOLIC PANEL
BUN/Creatinine Ratio: 23 (ref 9–23)
BUN: 13 mg/dL (ref 6–20)
CO2: 24 mmol/L (ref 20–29)
Calcium: 9.8 mg/dL (ref 8.7–10.2)
Chloride: 103 mmol/L (ref 96–106)
Creatinine, Ser: 0.57 mg/dL (ref 0.57–1.00)
GFR calc Af Amer: 144 mL/min/{1.73_m2} (ref >59)
GFR calc non Af Amer: 125 mL/min/{1.73_m2} (ref >59)
Glucose: 87 mg/dL (ref 65–99)
Potassium: 4.5 mmol/L (ref 3.5–5.2)
Sodium: 140 mmol/L (ref 134–144)

## 2019-04-10 LAB — MAGNESIUM: Magnesium: 2.1 mg/dL (ref 1.6–2.3)

## 2019-04-10 LAB — TSH: TSH: 1.33 u[IU]/mL (ref 0.450–4.500)

## 2019-04-13 ENCOUNTER — Telehealth (HOSPITAL_COMMUNITY): Payer: Self-pay | Admitting: Emergency Medicine

## 2019-04-13 NOTE — Telephone Encounter (Signed)
Left message on voicemail with name and callback number Brookley Spitler RN Navigator Cardiac [facility] [phone] Office [phone] Cell  

## 2019-04-15 ENCOUNTER — Ambulatory Visit (HOSPITAL_COMMUNITY)
Admission: RE | Admit: 2019-04-15 | Discharge: 2019-04-15 | Disposition: A | Discharge status: Home / Self Care | Payer: BC Managed Care – PPO | Source: Ambulatory Visit | Attending: Cardiology | Admitting: Cardiology

## 2019-04-15 ENCOUNTER — Other Ambulatory Visit: Payer: Self-pay

## 2019-04-15 ENCOUNTER — Encounter: Payer: BC Managed Care – PPO | Admitting: *Deleted

## 2019-04-15 DIAGNOSIS — Z006 Encounter for examination for normal comparison and control in clinical research program: Secondary | ICD-10-CM

## 2019-04-15 DIAGNOSIS — R079 Chest pain, unspecified: Secondary | ICD-10-CM | POA: Diagnosis not present

## 2019-04-15 MED ORDER — NITROGLYCERIN 0.4 MG SL SUBL
SUBLINGUAL_TABLET | SUBLINGUAL | Status: AC
  Filled 2019-04-15: qty 1

## 2019-04-15 MED ORDER — IOHEXOL 350 MG/ML SOLN
100.0000 mL | Freq: Once | INTRAVENOUS | Status: AC | PRN
  Administered 2019-04-15: 100 mL via INTRAVENOUS

## 2019-04-15 MED ORDER — NITROGLYCERIN 0.4 MG SL SUBL
0.4000 mg | SUBLINGUAL_TABLET | Freq: Once | SUBLINGUAL | Status: AC
  Administered 2019-04-15: 0.4 mg via SUBLINGUAL

## 2019-04-15 NOTE — Research (Signed)
CADFEM Informed Consent                  Subject Name:   Molly Gibbs   Subject met inclusion and exclusion criteria.  The informed consent form, study requirements and expectations were reviewed with the subject and questions and concerns were addressed prior to the signing of the consent form.  The subject verbalized understanding of the trial requirements.  The subject agreed to participate in the CADFEM trial and signed the informed consent.  The informed consent was obtained prior to performance of any protocol-specific procedures for the subject.  A copy of the signed informed consent was given to the subject and a copy was placed in the subject's medical record.   [place] Kamarian Sahakian, Research Assistant  04/15/2019  13:30

## 2019-04-16 ENCOUNTER — Telehealth: Payer: Self-pay | Admitting: *Deleted

## 2019-04-16 ENCOUNTER — Ambulatory Visit (HOSPITAL_COMMUNITY): Payer: BC Managed Care – PPO

## 2019-04-16 NOTE — Telephone Encounter (Signed)
-----   Message from Berniece Salines, DO sent at 04/15/2019  9:30 PM EST ----- Normal study

## 2019-04-16 NOTE — Telephone Encounter (Signed)
Telephone call to patient. Left message that cTa was normal and to call with any questions.

## 2019-05-07 DIAGNOSIS — F4323 Adjustment disorder with mixed anxiety and depressed mood: Secondary | ICD-10-CM | POA: Diagnosis not present

## 2019-06-04 DIAGNOSIS — F4323 Adjustment disorder with mixed anxiety and depressed mood: Secondary | ICD-10-CM | POA: Diagnosis not present

## 2019-06-24 DIAGNOSIS — J029 Acute pharyngitis, unspecified: Secondary | ICD-10-CM | POA: Diagnosis not present

## 2019-07-02 ENCOUNTER — Ambulatory Visit: Payer: BC Managed Care – PPO | Admitting: Cardiology

## 2019-07-02 DIAGNOSIS — F4323 Adjustment disorder with mixed anxiety and depressed mood: Secondary | ICD-10-CM | POA: Diagnosis not present

## 2019-07-27 ENCOUNTER — Ambulatory Visit: Payer: BC Managed Care – PPO | Admitting: Cardiology

## 2019-07-27 ENCOUNTER — Other Ambulatory Visit: Payer: Self-pay

## 2019-07-27 ENCOUNTER — Encounter: Payer: Self-pay | Admitting: Cardiology

## 2019-07-27 VITALS — BP 118/74 | HR 83 | Temp 97.7°F | Ht 63.0 in | Wt 165.1 lb

## 2019-07-27 DIAGNOSIS — R0789 Other chest pain: Secondary | ICD-10-CM | POA: Insufficient documentation

## 2019-07-27 DIAGNOSIS — Z720 Tobacco use: Secondary | ICD-10-CM | POA: Diagnosis not present

## 2019-07-27 DIAGNOSIS — E782 Mixed hyperlipidemia: Secondary | ICD-10-CM

## 2019-07-27 NOTE — Patient Instructions (Signed)

## 2019-07-27 NOTE — Progress Notes (Signed)
Cardiology Office Note:    Date:  07/27/2019   ID:  Molly Gibbs, DOB 1991/03/30, MRN [id number]  PCP:  Magdalene River, PA-C  Cardiologist:  Thomasene Ripple, DO  Electrophysiologist:  None   Referring MD: Benjiman Core D, PA*   Follow-up atypical chest pain  History of Present Illness:    Molly Gibbs is a 29 y.o. female with a hx of anxiety, hyperlipidemia, current smoker presented initially for atypical chest pain.  Given her significant family history and the fact that the patient is a smoker I did recommend that she undergo a coronary CTA as well as an echocardiogram.  Test was normal.  Here today for follow-up visit.  She has not had any episodes of chest pain.  She is doing well.  She is lost a lot of weight and she is very happy about this.  She offers no complaints at this time.  Past Medical History:  Diagnosis Date  . Anxiety   . Asthma   . Blood transfusion without reported diagnosis   . Depression   . GERD (gastroesophageal reflux disease)     Past Surgical History:  Procedure Laterality Date  . CESAREAN SECTION    . DILATION AND EVACUATION N/A 10/29/2012   Procedure: DILATATION AND EVACUATION WITH ULTRASOUND;  Surgeon: Alphonsus Sias. Ernestina Penna, MD;  Location: [facility];  Service: Gynecology;  Laterality: N/A;  CHROMOSOME STUDIES;POSSIBLE MOLAR PREGNANCY.   Bladder instillation performed    Current Medications: No outpatient medications have been marked as taking for the 07/27/19 encounter (Office Visit) with Thomasene Ripple, DO.     Allergies:   Codeine and Imitrex [sumatriptan]   Social History   Socioeconomic History  . Marital status: Married    Spouse name: Not on file  . Number of children: 1  . Years of education: College  . Highest education level: Not on file  Occupational History    Employer: [employer]    Comment: [employer]  Tobacco Use  . Smoking status: Current Every Day Smoker    Packs/day: 1.00    Years: 4.00    Pack years: 4.00      Types: Cigarettes  . Smokeless tobacco: Never Used  Substance and Sexual Activity  . Alcohol use: Yes    Comment: occasionally 1 drink weekly  . Drug use: No  . Sexual activity: Yes    Birth control/protection: Inserts  Other Topics Concern  . Not on file  Social History Narrative   Patient lives at home with her daughter.   Caffeine Use: 2 sodas daily   Social Determinants of Health   Financial Resource Strain:   . Difficulty of Paying Living Expenses: Not on file  Food Insecurity:   . Worried About Programme researcher, broadcasting/film/video in the Last Year: Not on file  . Ran Out of Food in the Last Year: Not on file  Transportation Needs:   . Lack of Transportation (Medical): Not on file  . Lack of Transportation (Non-Medical): Not on file  Physical Activity:   . Days of Exercise per Week: Not on file  . Minutes of Exercise per Session: Not on file  Stress:   . Feeling of Stress : Not on file  Social Connections:   . Frequency of Communication with Friends and Family: Not on file  . Frequency of Social Gatherings with Friends and Family: Not on file  . Attends Religious Services: Not on file  . Active Member of Clubs or Organizations: Not on file  .  Attends Banker Meetings: Not on file  . Marital Status: Not on file     Family History: The patient's family history includes Arthritis in her maternal grandfather, maternal grandmother, paternal grandfather, and paternal grandmother; Cancer in her maternal grandmother and paternal grandfather.  ROS:   Review of Systems  Constitution: Negative for decreased appetite, fever and weight gain.  HENT: Negative for congestion, ear discharge, hoarse voice and sore throat.   Eyes: Negative for discharge, redness, vision loss in right eye and visual halos.  Cardiovascular: Negative for chest pain, dyspnea on exertion, leg swelling, orthopnea and palpitations.  Respiratory: Negative for cough, hemoptysis, shortness of breath and  snoring.   Endocrine: Negative for heat intolerance and polyphagia.  Hematologic/Lymphatic: Negative for bleeding problem. Does not bruise/bleed easily.  Skin: Negative for flushing, nail changes, rash and suspicious lesions.  Musculoskeletal: Negative for arthritis, joint pain, muscle cramps, myalgias, neck pain and stiffness.  Gastrointestinal: Negative for abdominal pain, bowel incontinence, diarrhea and excessive appetite.  Genitourinary: Negative for decreased libido, genital sores and incomplete emptying.  Neurological: Negative for brief paralysis, focal weakness, headaches and loss of balance.  Psychiatric/Behavioral: Negative for altered mental status, depression and suicidal ideas.  Allergic/Immunologic: Negative for HIV exposure and persistent infections.    EKGs/Labs/Other Studies Reviewed:    The following studies were reviewed today:   EKG: None today   Coronary CTA IMPRESSION: April 15, 2019 1. Calcium score 0  2.  Normal right dominant coronary arteries CAD RADS 0  3.  Normal aortic root 3.0 cm  Transthoracic echocardiogram March 06, 2019 IMPRESSIONS: 1. Left ventricular ejection fraction, by visual estimation, is 60 to  65%. The left ventricle has normal function. Normal left ventricular size.  There is no left ventricular hypertrophy.  2. Global right ventricle has normal systolic function.The right  ventricular size is normal. No increase in right ventricular wall  thickness.  3. Left atrial size was normal.  4. Right atrial size was normal.  5. The mitral valve is normal in structure. No evidence of mitral valve  regurgitation. No evidence of mitral stenosis.  6. The tricuspid valve is normal in structure. Tricuspid valve  regurgitation was not visualized by color flow Doppler.  7. The aortic valve is normal in structure. Aortic valve regurgitation  was not visualized by color flow Doppler. Structurally normal aortic  valve, with no evidence  of sclerosis or stenosis.  8. The pulmonic valve was normal in structure. Pulmonic valve  regurgitation is not visualized by color flow Doppler.  9. The inferior vena cava is normal in size with greater than 50%  respiratory variability, suggesting right atrial pressure of 3 mmHg.   Recent Labs: 04/09/2019: BUN 13; Creatinine, Ser 0.57; Magnesium 2.1; Potassium 4.5; Sodium 140; TSH 1.330  Recent Lipid Panel    Component Value Date/Time   CHOL 159 03/05/2019 1047   TRIG 66 03/05/2019 1047   HDL 33 (L) 03/05/2019 1047   CHOLHDL 4.8 (H) 03/05/2019 1047   LDLCALC 113 (H) 03/05/2019 1047    Physical Exam:    VS:  BP 118/74   Pulse 83   Temp 97.7 F (36.5 C)   Ht 5\' 3"  (1.6 m)   Wt 165 lb 1.3 oz (74.9 kg)   SpO2 98%   BMI 29.24 kg/m     Wt Readings from Last 3 Encounters:  07/27/19 165 lb 1.3 oz (74.9 kg)  04/09/19 171 lb (77.6 kg)  03/05/19 165 lb (74.8 kg)  GEN: Well nourished, well developed in no acute distress HEENT: Normal NECK: No JVD; No carotid bruits LYMPHATICS: No lymphadenopathy CARDIAC: S1S2 noted,RRR, no murmurs, rubs, gallops RESPIRATORY:  Clear to auscultation without rales, wheezing or rhonchi  ABDOMEN: Soft, non-tender, non-distended, +bowel sounds, no guarding. EXTREMITIES: No edema, No cyanosis, no clubbing MUSCULOSKELETAL:  No deformity  SKIN: Warm and dry NEUROLOGIC:  Alert and oriented x 3, non-focal PSYCHIATRIC:  Normal affect, good insight  ASSESSMENT:    1. Atypical chest pain   2. Mixed hyperlipidemia   3. Tobacco use    PLAN:     1.  Recurrent chest pain.  Thankfully her coronary CTA was normal with her echocardiogram therefore her chest pain may not be cardiac in nature.  2.  Hyperlipidemia-diet controlled.  3.  Tobacco use-we discussed this in detail.  She has been helped in the past with nicotine patches and Chantix to quit smoking.  When she is ready she will follow with her PCP to start her process and hopefully at that  time she can get more nicotine patches.   The patient is in agreement with the above plan. The patient left the office in stable condition.  The patient will follow up in 1 year or as needed   Medication Adjustments/Labs and Tests Ordered: Current medicines are reviewed at length with the patient today.  Concerns regarding medicines are outlined above.  No orders of the defined types were placed in this encounter.  No orders of the defined types were placed in this encounter.   There are no Patient Instructions on file for this visit.   Adopting a Healthy Lifestyle.  Know what a healthy weight is for you (roughly BMI <25) and aim to maintain this   Aim for 7+ servings of fruits and vegetables daily   65-80+ fluid ounces of water or unsweet tea for healthy kidneys   Limit to max 1 drink of alcohol per day; avoid smoking/tobacco   Limit animal fats in diet for cholesterol and heart health - choose grass fed whenever available   Avoid highly processed foods, and foods high in saturated/trans fats   Aim for low stress - take time to unwind and care for your mental health   Aim for 150 min of moderate intensity exercise weekly for heart health, and weights twice weekly for bone health   Aim for 7-9 hours of sleep daily   When it comes to diets, agreement about the perfect plan isnt easy to find, even among the experts. Experts at the [employer] developed an idea known as the Healthy Eating Plate. Just imagine a plate divided into logical, healthy portions.   The emphasis is on diet quality:   Load up on vegetables and fruits - one-half of your plate: Aim for color and variety, and remember that potatoes dont count.   Go for whole grains - one-quarter of your plate: Whole wheat, barley, wheat berries, quinoa, oats, brown rice, and foods made with them. If you want pasta, go with whole wheat pasta.   Protein power - one-quarter of your plate: Fish, chicken,  beans, and nuts are all healthy, versatile protein sources. Limit red meat.   The diet, however, does go beyond the plate, offering a few other suggestions.   Use healthy plant oils, such as olive, canola, soy, corn, sunflower and peanut. Check the labels, and avoid partially hydrogenated oil, which have unhealthy trans fats.   If youre thirsty, drink water. Coffee  and tea are good in moderation, but skip sugary drinks and limit milk and dairy products to one or two daily servings.   The type of carbohydrate in the diet is more important than the amount. Some sources of carbohydrates, such as vegetables, fruits, whole grains, and beans-are healthier than others.   Finally, stay active  Signed, Thomasene Ripple, DO  07/27/2019 4:43 PM    [facility]

## 2019-07-30 DIAGNOSIS — F4323 Adjustment disorder with mixed anxiety and depressed mood: Secondary | ICD-10-CM | POA: Diagnosis not present

## 2019-08-27 DIAGNOSIS — F4323 Adjustment disorder with mixed anxiety and depressed mood: Secondary | ICD-10-CM | POA: Diagnosis not present

## 2019-09-16 DIAGNOSIS — H16041 Marginal corneal ulcer, right eye: Secondary | ICD-10-CM | POA: Diagnosis not present

## 2019-09-21 DIAGNOSIS — H16041 Marginal corneal ulcer, right eye: Secondary | ICD-10-CM | POA: Diagnosis not present

## 2019-09-24 DIAGNOSIS — F4323 Adjustment disorder with mixed anxiety and depressed mood: Secondary | ICD-10-CM | POA: Diagnosis not present

## 2019-10-22 DIAGNOSIS — F4323 Adjustment disorder with mixed anxiety and depressed mood: Secondary | ICD-10-CM | POA: Diagnosis not present

## 2019-11-19 DIAGNOSIS — F4323 Adjustment disorder with mixed anxiety and depressed mood: Secondary | ICD-10-CM | POA: Diagnosis not present

## 2019-12-17 DIAGNOSIS — F4323 Adjustment disorder with mixed anxiety and depressed mood: Secondary | ICD-10-CM | POA: Diagnosis not present

## 2019-12-22 DIAGNOSIS — Z1152 Encounter for screening for COVID-19: Secondary | ICD-10-CM | POA: Diagnosis not present

## 2020-01-28 DIAGNOSIS — F4323 Adjustment disorder with mixed anxiety and depressed mood: Secondary | ICD-10-CM | POA: Diagnosis not present

## 2020-02-03 DIAGNOSIS — Z20822 Contact with and (suspected) exposure to covid-19: Secondary | ICD-10-CM | POA: Diagnosis not present

## 2020-02-03 DIAGNOSIS — J069 Acute upper respiratory infection, unspecified: Secondary | ICD-10-CM | POA: Diagnosis not present

## 2020-02-25 DIAGNOSIS — F4323 Adjustment disorder with mixed anxiety and depressed mood: Secondary | ICD-10-CM | POA: Diagnosis not present

## 2020-03-21 ENCOUNTER — Encounter: Payer: Self-pay | Admitting: Registered Nurse

## 2020-03-21 ENCOUNTER — Other Ambulatory Visit: Payer: Self-pay

## 2020-03-21 ENCOUNTER — Ambulatory Visit: Payer: BC Managed Care – PPO | Admitting: Registered Nurse

## 2020-03-21 VITALS — BP 111/71 | HR 80 | Temp 98.9°F | Resp 18 | Ht 63.0 in | Wt 163.0 lb

## 2020-03-21 DIAGNOSIS — Z13220 Encounter for screening for lipoid disorders: Secondary | ICD-10-CM

## 2020-03-21 DIAGNOSIS — Z130 Encounter for screening for diseases of the blood and blood-forming organs and certain disorders involving the immune mechanism: Secondary | ICD-10-CM | POA: Diagnosis not present

## 2020-03-21 DIAGNOSIS — Z13228 Encounter for screening for other metabolic disorders: Secondary | ICD-10-CM | POA: Diagnosis not present

## 2020-03-21 DIAGNOSIS — F32A Depression, unspecified: Secondary | ICD-10-CM | POA: Diagnosis not present

## 2020-03-21 DIAGNOSIS — F419 Anxiety disorder, unspecified: Secondary | ICD-10-CM

## 2020-03-21 DIAGNOSIS — Z1329 Encounter for screening for other suspected endocrine disorder: Secondary | ICD-10-CM

## 2020-03-21 MED ORDER — FLUOXETINE HCL 20 MG PO TABS: 20.0000 mg | ORAL_TABLET | Freq: Every day | ORAL | 0 refills | Status: DC

## 2020-03-21 NOTE — Patient Instructions (Signed)
° ° ° °  If you have lab work done today you will be contacted with your lab results within the next 2 weeks.  If you have not heard from us then please contact us. The fastest way to get your results is to register for My Chart. ° ° °IF you received an x-ray today, you will receive an invoice from [facility]. Please contact [facility] at [phone] with questions or concerns regarding your invoice.  ° °IF you received labwork today, you will receive an invoice from [facility]. Please contact [facility] at [phone] with questions or concerns regarding your invoice.  ° °Our billing staff will not be able to assist you with questions regarding bills from these companies. ° °You will be contacted with the lab results as soon as they are available. The fastest way to get your results is to activate your My Chart account. Instructions are located on the last page of this paperwork. If you have not heard from us regarding the results in 2 weeks, please contact this office. °  ° ° ° °

## 2020-03-22 LAB — CBC WITH DIFFERENTIAL
Basophils Absolute: 0 10*3/uL (ref 0.0–0.2)
Basos: 1 %
EOS (ABSOLUTE): 0.1 10*3/uL (ref 0.0–0.4)
Eos: 2 %
Hematocrit: 39.1 % (ref 34.0–46.6)
Hemoglobin: 13.2 g/dL (ref 11.1–15.9)
Immature Grans (Abs): 0 10*3/uL (ref 0.0–0.1)
Immature Granulocytes: 0 %
Lymphocytes Absolute: 2 10*3/uL (ref 0.7–3.1)
Lymphs: 32 %
MCH: 31.9 pg (ref 26.6–33.0)
MCHC: 33.8 g/dL (ref 31.5–35.7)
MCV: 94 fL (ref 79–97)
Monocytes Absolute: 0.4 10*3/uL (ref 0.1–0.9)
Monocytes: 7 %
Neutrophils Absolute: 3.6 10*3/uL (ref 1.4–7.0)
Neutrophils: 58 %
RBC: 4.14 x10E6/uL (ref 3.77–5.28)
RDW: 11.8 % (ref 11.7–15.4)
WBC: 6.1 10*3/uL (ref 3.4–10.8)

## 2020-03-22 LAB — LIPID PANEL
Chol/HDL Ratio: 3.5 ratio (ref 0.0–4.4)
Cholesterol, Total: 134 mg/dL (ref 100–199)
HDL: 38 mg/dL — ABNORMAL LOW (ref >39)
LDL Chol Calc (NIH): 84 mg/dL (ref 0–99)
Triglycerides: 57 mg/dL (ref 0–149)
VLDL Cholesterol Cal: 12 mg/dL (ref 5–40)

## 2020-03-22 LAB — COMPREHENSIVE METABOLIC PANEL
ALT: 13 IU/L (ref 0–32)
AST: 15 IU/L (ref 0–40)
Albumin/Globulin Ratio: 2 (ref 1.2–2.2)
Albumin: 4.5 g/dL (ref 3.8–4.8)
Alkaline Phosphatase: 69 IU/L (ref 44–121)
BUN/Creatinine Ratio: 9 (ref 9–23)
BUN: 6 mg/dL (ref 6–20)
Bilirubin Total: 0.5 mg/dL (ref 0.0–1.2)
CO2: 25 mmol/L (ref 20–29)
Calcium: 9.3 mg/dL (ref 8.7–10.2)
Chloride: 105 mmol/L (ref 96–106)
Creatinine, Ser: 0.65 mg/dL (ref 0.57–1.00)
GFR calc Af Amer: 137 mL/min/{1.73_m2} (ref >59)
GFR calc non Af Amer: 119 mL/min/{1.73_m2} (ref >59)
Globulin, Total: 2.3 g/dL (ref 1.5–4.5)
Glucose: 87 mg/dL (ref 65–99)
Potassium: 4.5 mmol/L (ref 3.5–5.2)
Sodium: 142 mmol/L (ref 134–144)
Total Protein: 6.8 g/dL (ref 6.0–8.5)

## 2020-03-22 LAB — TSH: TSH: 1.53 u[IU]/mL (ref 0.450–4.500)

## 2020-03-22 LAB — HEMOGLOBIN A1C
Est. average glucose Bld gHb Est-mCnc: 100 mg/dL
Hgb A1c MFr Bld: 5.1 % (ref 4.8–5.6)

## 2020-03-24 ENCOUNTER — Encounter: Payer: Self-pay | Admitting: Registered Nurse

## 2020-03-24 DIAGNOSIS — F4323 Adjustment disorder with mixed anxiety and depressed mood: Secondary | ICD-10-CM | POA: Diagnosis not present

## 2020-04-04 DIAGNOSIS — N6311 Unspecified lump in the right breast, upper outer quadrant: Secondary | ICD-10-CM | POA: Diagnosis not present

## 2020-04-04 DIAGNOSIS — Z6827 Body mass index (BMI) 27.0-27.9, adult: Secondary | ICD-10-CM | POA: Diagnosis not present

## 2020-04-04 DIAGNOSIS — Z1151 Encounter for screening for human papillomavirus (HPV): Secondary | ICD-10-CM | POA: Diagnosis not present

## 2020-04-04 DIAGNOSIS — Z309 Encounter for contraceptive management, unspecified: Secondary | ICD-10-CM | POA: Diagnosis not present

## 2020-04-04 DIAGNOSIS — Z01419 Encounter for gynecological examination (general) (routine) without abnormal findings: Secondary | ICD-10-CM | POA: Diagnosis not present

## 2020-04-06 ENCOUNTER — Other Ambulatory Visit: Payer: Self-pay | Admitting: Obstetrics

## 2020-04-06 DIAGNOSIS — N6310 Unspecified lump in the right breast, unspecified quadrant: Secondary | ICD-10-CM

## 2020-04-20 DIAGNOSIS — Z30432 Encounter for removal of intrauterine contraceptive device: Secondary | ICD-10-CM | POA: Diagnosis not present

## 2020-04-25 ENCOUNTER — Ambulatory Visit: Payer: BC Managed Care – PPO | Admitting: Registered Nurse

## 2020-04-25 ENCOUNTER — Encounter: Payer: Self-pay | Admitting: Registered Nurse

## 2020-04-25 ENCOUNTER — Other Ambulatory Visit: Payer: Self-pay

## 2020-04-25 VITALS — BP 104/72 | HR 89 | Temp 97.8°F | Resp 18 | Ht 63.0 in | Wt 158.0 lb

## 2020-04-25 DIAGNOSIS — F32A Depression, unspecified: Secondary | ICD-10-CM

## 2020-04-25 DIAGNOSIS — F419 Anxiety disorder, unspecified: Secondary | ICD-10-CM | POA: Diagnosis not present

## 2020-04-25 NOTE — Progress Notes (Signed)
Established Patient Office Visit  Subjective:  Patient ID: Molly Gibbs, female    DOB: 08-15-1990  Age: 29 y.o. MRN: [id number]  CC:  Chief Complaint  Patient presents with  . Follow-up    Patient states she is here for an follow up om medication.Per patient she has no questions or concerns    HPI Molly Gibbs presents for follow up on anxiety  At last visit we started fluoxetine 20mg  PO qd Pt reports great effect. Feeling well overall. Denies AEs. Feels stable Denies hi/si Does not wish to change Notes occ trouble falling asleep but uses melatonin gummies w good effect.   no other concerns.  Past Medical History:  Diagnosis Date  . Anxiety   . Asthma   . Blood transfusion without reported diagnosis   . Depression   . GERD (gastroesophageal reflux disease)     Past Surgical History:  Procedure Laterality Date  . CESAREAN SECTION    . DILATION AND EVACUATION N/A 10/29/2012   Procedure: DILATATION AND EVACUATION WITH ULTRASOUND;  Surgeon: 12/29/2012. Alphonsus Sias, MD;  Location: [facility];  Service: Gynecology;  Laterality: N/A;  CHROMOSOME STUDIES;POSSIBLE MOLAR PREGNANCY.   Bladder instillation performed    Family History  Problem Relation Age of Onset  . Arthritis Maternal Grandmother        Rheumatoid arthritis  . Breast cancer Maternal Grandmother        breast  . Heart disease Maternal Grandmother   . Arthritis Maternal Grandfather   . Arthritis Paternal Grandmother        Osteoarthritis  . Heart disease Paternal Grandmother   . Atrial fibrillation Paternal Grandmother   . Arthritis Paternal Grandfather        Osteoarthritis  . Lung cancer Paternal Grandfather        lung    Social History   Socioeconomic History  . Marital status: Married    Spouse name: Not on file  . Number of children: 1  . Years of education: College  . Highest education level: Not on file  Occupational History    Employer: [employer]    Comment: [employer]  Tobacco  Use  . Smoking status: Current Every Day Smoker    Packs/day: 0.50    Years: 9.00    Pack years: 4.50    Types: Cigarettes  . Smokeless tobacco: Never Used  Vaping Use  . Vaping Use: Never used  Substance and Sexual Activity  . Alcohol use: Yes    Comment: occasionally 1 drink weekly  . Drug use: No  . Sexual activity: Yes    Birth control/protection: Inserts  Other Topics Concern  . Not on file  Social History Narrative   Patient lives at home with her daughter.   Caffeine Use: 2 sodas daily   Social Determinants of Health   Financial Resource Strain:   . Difficulty of Paying Living Expenses: Not on file  Food Insecurity:   . Worried About [employer] in the Last Year: Not on file  . Ran Out of Food in the Last Year: Not on file  Transportation Needs:   . Lack of Transportation (Medical): Not on file  . Lack of Transportation (Non-Medical): Not on file  Physical Activity:   . Days of Exercise per Week: Not on file  . Minutes of Exercise per Session: Not on file  Stress:   . Feeling of Stress : Not on file  Social Connections:   . Frequency of Communication  with Friends and Family: Not on file  . Frequency of Social Gatherings with Friends and Family: Not on file  . Attends Religious Services: Not on file  . Active Member of Clubs or Organizations: Not on file  . Attends Banker Meetings: Not on file  . Marital Status: Not on file  Intimate Partner Violence:   . Fear of Current or Ex-Partner: Not on file  . Emotionally Abused: Not on file  . Physically Abused: Not on file  . Sexually Abused: Not on file    Outpatient Medications Prior to Visit  Medication Sig Dispense Refill  . Aspirin-Acetaminophen-Caffeine (EXCEDRIN MIGRAINE PO) Take 1 tablet by mouth daily.    Marland Kitchen EPINEPHrine (EPIPEN) 0.3 mg/0.3 mL SOAJ injection Inject 0.3 mLs (0.3 mg total) into the muscle once. 2 Device 3  . FLUoxetine (PROZAC) 20 MG tablet Take 1 tablet (20 mg total) by  mouth daily. 90 tablet 0  . ibuprofen (ADVIL,MOTRIN) 200 MG tablet Take 400 mg by mouth every 6 (six) hours as needed for pain (for migraines).    . nitroGLYCERIN (NITROSTAT) 0.4 MG SL tablet Place 1 tablet (0.4 mg total) under the tongue every 5 (five) minutes as needed for chest pain. 90 tablet 3   No facility-administered medications prior to visit.    Allergies  Allergen Reactions  . Other Anaphylaxis and Swelling  . Codeine     swelling  . Imitrex [Sumatriptan] Swelling    tongue    ROS Review of Systems  Constitutional: Negative.   HENT: Negative.   Eyes: Negative.   Respiratory: Negative.   Cardiovascular: Negative.   Gastrointestinal: Negative.   Genitourinary: Negative.   Musculoskeletal: Negative.   Skin: Negative.   Neurological: Negative.   Psychiatric/Behavioral: Negative.       Objective:    Physical Exam Vitals and nursing note reviewed.  Constitutional:      General: She is not in acute distress.    Appearance: Normal appearance. She is normal weight. She is not ill-appearing, toxic-appearing or diaphoretic.  Cardiovascular:     Rate and Rhythm: Normal rate and regular rhythm.     Heart sounds: Normal heart sounds. No murmur heard.  No friction rub. No gallop.   Pulmonary:     Effort: Pulmonary effort is normal. No respiratory distress.     Breath sounds: Normal breath sounds. No stridor. No wheezing, rhonchi or rales.  Chest:     Chest wall: No tenderness.  Skin:    General: Skin is warm and dry.  Neurological:     General: No focal deficit present.     Mental Status: She is alert and oriented to person, place, and time. Mental status is at baseline.  Psychiatric:        Mood and Affect: Mood normal.        Behavior: Behavior normal.        Thought Content: Thought content normal.        Judgment: Judgment normal.     BP 104/72   Pulse 89   Temp 97.8 F (36.6 C) (Temporal)   Resp 18   Ht 5\' 3"  (1.6 m)   Wt 158 lb (71.7 kg)   LMP  04/21/2020   SpO2 100%   BMI 27.99 kg/m  Wt Readings from Last 3 Encounters:  04/25/20 158 lb (71.7 kg)  03/21/20 163 lb (73.9 kg)  07/27/19 165 lb 1.3 oz (74.9 kg)     There are no preventive care reminders to  display for this patient.  There are no preventive care reminders to display for this patient.  Lab Results  Component Value Date   TSH 1.530 03/21/2020   Lab Results  Component Value Date   WBC 6.1 03/21/2020   HGB 13.2 03/21/2020   HCT 39.1 03/21/2020   MCV 94 03/21/2020   PLT 263 10/02/2017   Lab Results  Component Value Date   NA 142 03/21/2020   K 4.5 03/21/2020   CO2 25 03/21/2020   GLUCOSE 87 03/21/2020   BUN 6 03/21/2020   CREATININE 0.65 03/21/2020   BILITOT 0.5 03/21/2020   ALKPHOS 69 03/21/2020   AST 15 03/21/2020   ALT 13 03/21/2020   PROT 6.8 03/21/2020   ALBUMIN 4.5 03/21/2020   CALCIUM 9.3 03/21/2020   Lab Results  Component Value Date   CHOL 134 03/21/2020   Lab Results  Component Value Date   HDL 38 (L) 03/21/2020   Lab Results  Component Value Date   LDLCALC 84 03/21/2020   Lab Results  Component Value Date   TRIG 57 03/21/2020   Lab Results  Component Value Date   CHOLHDL 3.5 03/21/2020   Lab Results  Component Value Date   HGBA1C 5.1 03/21/2020      Assessment & Plan:   Problem List Items Addressed This Visit    None    Visit Diagnoses    Anxiety and depression    -  Primary      No orders of the defined types were placed in this encounter.   Follow-up: No follow-ups on file.   PLAN  Continue fluoxetine  May increase to 40mg  PO qd if plateau reached or effect wanes  Return to office annually or with concerns  Patient encouraged to call clinic with any questions, comments, or concerns.  , NP

## 2020-04-25 NOTE — Patient Instructions (Signed)
° ° ° °  If you have lab work done today you will be contacted with your lab results within the next 2 weeks.  If you have not heard from us then please contact us. The fastest way to get your results is to register for My Chart. ° ° °IF you received an x-ray today, you will receive an invoice from [facility]. Please contact [facility] at [phone] with questions or concerns regarding your invoice.  ° °IF you received labwork today, you will receive an invoice from [facility]. Please contact [facility] at [phone] with questions or concerns regarding your invoice.  ° °Our billing staff will not be able to assist you with questions regarding bills from these companies. ° °You will be contacted with the lab results as soon as they are available. The fastest way to get your results is to activate your My Chart account. Instructions are located on the last page of this paperwork. If you have not heard from us regarding the results in 2 weeks, please contact this office. °  ° ° ° °

## 2020-04-29 DIAGNOSIS — N854 Malposition of uterus: Secondary | ICD-10-CM | POA: Diagnosis not present

## 2020-04-29 DIAGNOSIS — Z5309 Procedure and treatment not carried out because of other contraindication: Secondary | ICD-10-CM | POA: Diagnosis not present

## 2020-05-04 ENCOUNTER — Other Ambulatory Visit: Payer: Self-pay | Admitting: Obstetrics

## 2020-05-04 ENCOUNTER — Ambulatory Visit
Admission: RE | Admit: 2020-05-04 | Discharge: 2020-05-04 | Disposition: A | Discharge status: Home / Self Care | Payer: BC Managed Care – PPO | Source: Ambulatory Visit | Attending: Obstetrics | Admitting: Obstetrics

## 2020-05-04 ENCOUNTER — Other Ambulatory Visit: Payer: Self-pay

## 2020-05-04 DIAGNOSIS — N6310 Unspecified lump in the right breast, unspecified quadrant: Secondary | ICD-10-CM

## 2020-05-04 DIAGNOSIS — N6321 Unspecified lump in the left breast, upper outer quadrant: Secondary | ICD-10-CM | POA: Diagnosis not present

## 2020-05-04 DIAGNOSIS — R921 Mammographic calcification found on diagnostic imaging of breast: Secondary | ICD-10-CM | POA: Diagnosis not present

## 2020-05-04 DIAGNOSIS — N6001 Solitary cyst of right breast: Secondary | ICD-10-CM | POA: Diagnosis not present

## 2020-05-05 DIAGNOSIS — F4323 Adjustment disorder with mixed anxiety and depressed mood: Secondary | ICD-10-CM | POA: Diagnosis not present

## 2020-06-02 DIAGNOSIS — F4323 Adjustment disorder with mixed anxiety and depressed mood: Secondary | ICD-10-CM | POA: Diagnosis not present

## 2020-06-09 ENCOUNTER — Encounter: Payer: Self-pay | Admitting: Registered Nurse

## 2020-06-09 NOTE — Progress Notes (Signed)
Established Patient Office Visit  Subjective:  Patient ID: Molly Gibbs, female    DOB: December 23, 1990  Age: 30 y.o. MRN: [id number]  CC:  Chief Complaint  Patient presents with  . Transitions Of Care    patient is here for TOC. PAtient she has been having problems with anxiety for years , but now its becoming an issue [id number]    HPI Molly Gibbs presents for visit to est care.   Anxiety Ongoing for years. Medication in remote hx, none recently. Notes that it's starting to affect her day to day life. No hi/si No self harm behaviors Coping has been difficult through covid  Otherwise, histories reviewed and updated as warranted Last labs reviewed, will draw today as well.   Past Medical History:  Diagnosis Date  . Anxiety   . Asthma   . Blood transfusion without reported diagnosis   . Depression   . GERD (gastroesophageal reflux disease)     Past Surgical History:  Procedure Laterality Date  . CESAREAN SECTION    . DILATION AND EVACUATION N/A 10/29/2012   Procedure: DILATATION AND EVACUATION WITH ULTRASOUND;  Surgeon: Alphonsus Sias. Ernestina Penna, MD;  Location: [facility];  Service: Gynecology;  Laterality: N/A;  CHROMOSOME STUDIES;POSSIBLE MOLAR PREGNANCY.   Bladder instillation performed    Family History  Problem Relation Age of Onset  . Arthritis Maternal Grandmother        Rheumatoid arthritis  . Breast cancer Maternal Grandmother        breast  . Heart disease Maternal Grandmother   . Arthritis Maternal Grandfather   . Arthritis Paternal Grandmother        Osteoarthritis  . Heart disease Paternal Grandmother   . Atrial fibrillation Paternal Grandmother   . Arthritis Paternal Grandfather        Osteoarthritis  . Lung cancer Paternal Grandfather        lung    Social History   Socioeconomic History  . Marital status: Married    Spouse name: Not on file  . Number of children: 1  . Years of education: College  . Highest education level: Not on file  Occupational  History    Employer: [employer]    Comment: [employer]  Tobacco Use  . Smoking status: Current Every Day Smoker    Packs/day: 0.50    Years: 9.00    Pack years: 4.50    Types: Cigarettes  . Smokeless tobacco: Never Used  Vaping Use  . Vaping Use: Never used  Substance and Sexual Activity  . Alcohol use: Yes    Comment: occasionally 1 drink weekly  . Drug use: No  . Sexual activity: Yes    Birth control/protection: Inserts  Other Topics Concern  . Not on file  Social History Narrative   Patient lives at home with her daughter.   Caffeine Use: 2 sodas daily   Social Determinants of Health   Financial Resource Strain: Not on file  Food Insecurity: Not on file  Transportation Needs: Not on file  Physical Activity: Not on file  Stress: Not on file  Social Connections: Not on file  Intimate Partner Violence: Not on file    Outpatient Medications Prior to Visit  Medication Sig Dispense Refill  . Aspirin-Acetaminophen-Caffeine (EXCEDRIN MIGRAINE PO) Take 1 tablet by mouth daily.    Marland Kitchen EPINEPHrine (EPIPEN) 0.3 mg/0.3 mL SOAJ injection Inject 0.3 mLs (0.3 mg total) into the muscle once. 2 Device 3  . ibuprofen (ADVIL,MOTRIN) 200 MG tablet Take  400 mg by mouth every 6 (six) hours as needed for pain (for migraines).    . nitroGLYCERIN (NITROSTAT) 0.4 MG SL tablet Place 1 tablet (0.4 mg total) under the tongue every 5 (five) minutes as needed for chest pain. 90 tablet 3   No facility-administered medications prior to visit.    Allergies  Allergen Reactions  . Other Anaphylaxis and Swelling  . Codeine     swelling  . Imitrex [Sumatriptan] Swelling    tongue    ROS Review of Systems  Constitutional: Negative.   HENT: Negative.   Eyes: Negative.   Respiratory: Negative.   Cardiovascular: Negative.   Gastrointestinal: Negative.   Genitourinary: Negative.   Musculoskeletal: Negative.   Skin: Negative.   Neurological: Negative.   Psychiatric/Behavioral: The  patient is nervous/anxious.       Objective:    Physical Exam Vitals and nursing note reviewed.  Constitutional:      General: She is not in acute distress.    Appearance: Normal appearance. She is normal weight. She is not ill-appearing, toxic-appearing or diaphoretic.  Cardiovascular:     Rate and Rhythm: Normal rate and regular rhythm.     Heart sounds: Normal heart sounds. No murmur heard. No friction rub. No gallop.   Pulmonary:     Effort: Pulmonary effort is normal. No respiratory distress.     Breath sounds: Normal breath sounds. No stridor. No wheezing, rhonchi or rales.  Chest:     Chest wall: No tenderness.  Skin:    General: Skin is warm and dry.  Neurological:     General: No focal deficit present.     Mental Status: She is alert and oriented to person, place, and time. Mental status is at baseline.  Psychiatric:        Mood and Affect: Mood normal.        Behavior: Behavior normal.        Thought Content: Thought content normal.        Judgment: Judgment normal.     BP 111/71   Pulse 80   Temp 98.9 F (37.2 C) (Temporal)   Resp 18   Ht 5\' 3"  (1.6 m)   Wt 163 lb (73.9 kg)   SpO2 98%   BMI 28.87 kg/m  Wt Readings from Last 3 Encounters:  04/25/20 158 lb (71.7 kg)  03/21/20 163 lb (73.9 kg)  07/27/19 165 lb 1.3 oz (74.9 kg)     Health Maintenance Due  Topic Date Due  . PAP SMEAR-Modifier  05/28/2012  . COVID-19 Vaccine (3 - Booster for Pfizer series) 04/13/2020    There are no preventive care reminders to display for this patient.  Lab Results  Component Value Date   TSH 1.530 03/21/2020   Lab Results  Component Value Date   WBC 6.1 03/21/2020   HGB 13.2 03/21/2020   HCT 39.1 03/21/2020   MCV 94 03/21/2020   PLT 263 10/02/2017   Lab Results  Component Value Date   NA 142 03/21/2020   K 4.5 03/21/2020   CO2 25 03/21/2020   GLUCOSE 87 03/21/2020   BUN 6 03/21/2020   CREATININE 0.65 03/21/2020   BILITOT 0.5 03/21/2020   ALKPHOS 69  03/21/2020   AST 15 03/21/2020   ALT 13 03/21/2020   PROT 6.8 03/21/2020   ALBUMIN 4.5 03/21/2020   CALCIUM 9.3 03/21/2020   Lab Results  Component Value Date   CHOL 134 03/21/2020   Lab Results  Component Value Date  HDL 38 (L) 03/21/2020   Lab Results  Component Value Date   LDLCALC 84 03/21/2020   Lab Results  Component Value Date   TRIG 57 03/21/2020   Lab Results  Component Value Date   CHOLHDL 3.5 03/21/2020   Lab Results  Component Value Date   HGBA1C 5.1 03/21/2020      Assessment & Plan:   Problem List Items Addressed This Visit   None   Visit Diagnoses    Anxiety and depression    -  Primary   Relevant Medications   FLUoxetine (PROZAC) 20 MG tablet   Screening for endocrine, metabolic and immunity disorder       Relevant Orders   Comprehensive metabolic panel (Completed)   CBC With Differential (Completed)   TSH (Completed)   Hemoglobin A1c (Completed)   Lipid screening       Relevant Orders   Lipid panel (Completed)      Meds ordered this encounter  Medications  . FLUoxetine (PROZAC) 20 MG tablet    Sig: Take 1 tablet (20 mg total) by mouth daily.    Dispense:  90 tablet    Refill:  0    Order Specific Question:   Supervising Provider    Answer:   Neva Seat, JEFFREY R [[id number]]    Follow-up: No follow-ups on file.   PLAN  Labs collected. Will follow up with the patient as warranted.  Start fluoxetine 20mg  PO qd. Increase to 40mg  after two weeks if desired.  Return for med check in 1-2 mos  Patient encouraged to call clinic with any questions, comments, or concerns.   , NP

## 2020-06-12 ENCOUNTER — Other Ambulatory Visit: Payer: Self-pay | Admitting: Registered Nurse

## 2020-06-12 DIAGNOSIS — F419 Anxiety disorder, unspecified: Secondary | ICD-10-CM

## 2020-06-21 ENCOUNTER — Encounter (HOSPITAL_BASED_OUTPATIENT_CLINIC_OR_DEPARTMENT_OTHER): Payer: Self-pay

## 2020-06-21 ENCOUNTER — Emergency Department (HOSPITAL_BASED_OUTPATIENT_CLINIC_OR_DEPARTMENT_OTHER)
Admission: EM | Admit: 2020-06-21 | Discharge: 2020-06-21 | Disposition: A | Discharge status: Home / Self Care | Payer: BC Managed Care – PPO | Attending: Emergency Medicine | Admitting: Emergency Medicine

## 2020-06-21 ENCOUNTER — Other Ambulatory Visit: Payer: Self-pay

## 2020-06-21 ENCOUNTER — Emergency Department (HOSPITAL_BASED_OUTPATIENT_CLINIC_OR_DEPARTMENT_OTHER): Payer: BC Managed Care – PPO

## 2020-06-21 DIAGNOSIS — F17210 Nicotine dependence, cigarettes, uncomplicated: Secondary | ICD-10-CM | POA: Diagnosis not present

## 2020-06-21 DIAGNOSIS — N83292 Other ovarian cyst, left side: Secondary | ICD-10-CM | POA: Diagnosis not present

## 2020-06-21 DIAGNOSIS — R1013 Epigastric pain: Secondary | ICD-10-CM | POA: Diagnosis not present

## 2020-06-21 DIAGNOSIS — R0602 Shortness of breath: Secondary | ICD-10-CM | POA: Insufficient documentation

## 2020-06-21 DIAGNOSIS — J45909 Unspecified asthma, uncomplicated: Secondary | ICD-10-CM | POA: Insufficient documentation

## 2020-06-21 DIAGNOSIS — R110 Nausea: Secondary | ICD-10-CM | POA: Diagnosis not present

## 2020-06-21 DIAGNOSIS — Z7982 Long term (current) use of aspirin: Secondary | ICD-10-CM | POA: Insufficient documentation

## 2020-06-21 DIAGNOSIS — N281 Cyst of kidney, acquired: Secondary | ICD-10-CM | POA: Diagnosis not present

## 2020-06-21 DIAGNOSIS — N854 Malposition of uterus: Secondary | ICD-10-CM | POA: Diagnosis not present

## 2020-06-21 LAB — CBC
HCT: 39 % (ref 36.0–46.0)
Hemoglobin: 13.4 g/dL (ref 12.0–15.0)
MCH: 31.5 pg (ref 26.0–34.0)
MCHC: 34.4 g/dL (ref 30.0–36.0)
MCV: 91.5 fL (ref 80.0–100.0)
Platelets: 225 10*3/uL (ref 150–400)
RBC: 4.26 MIL/uL (ref 3.87–5.11)
RDW: 11.8 % (ref 11.5–15.5)
WBC: 6.7 10*3/uL (ref 4.0–10.5)
nRBC: 0 % (ref 0.0–0.2)

## 2020-06-21 LAB — URINALYSIS, ROUTINE W REFLEX MICROSCOPIC
Bilirubin Urine: NEGATIVE
Glucose, UA: NEGATIVE mg/dL
Hgb urine dipstick: NEGATIVE
Ketones, ur: NEGATIVE mg/dL
Leukocytes,Ua: NEGATIVE
Nitrite: NEGATIVE
Protein, ur: NEGATIVE mg/dL
Specific Gravity, Urine: 1.01 (ref 1.005–1.030)
pH: 7.5 (ref 5.0–8.0)

## 2020-06-21 LAB — COMPREHENSIVE METABOLIC PANEL
ALT: 16 U/L (ref 0–44)
AST: 19 U/L (ref 15–41)
Albumin: 4.3 g/dL (ref 3.5–5.0)
Alkaline Phosphatase: 51 U/L (ref 38–126)
Anion gap: 10 (ref 5–15)
BUN: 14 mg/dL (ref 6–20)
CO2: 24 mmol/L (ref 22–32)
Calcium: 9 mg/dL (ref 8.9–10.3)
Chloride: 104 mmol/L (ref 98–111)
Creatinine, Ser: 0.58 mg/dL (ref 0.44–1.00)
GFR, Estimated: >60 mL/min (ref >60)
Glucose, Bld: 94 mg/dL (ref 70–99)
Potassium: 3.6 mmol/L (ref 3.5–5.1)
Sodium: 138 mmol/L (ref 135–145)
Total Bilirubin: 0.2 mg/dL — ABNORMAL LOW (ref 0.3–1.2)
Total Protein: 7.4 g/dL (ref 6.5–8.1)

## 2020-06-21 LAB — LIPASE, BLOOD: Lipase: 28 U/L (ref 11–51)

## 2020-06-21 LAB — TROPONIN I (HIGH SENSITIVITY): Troponin I (High Sensitivity): <2 ng/L (ref <18)

## 2020-06-21 LAB — PREGNANCY, URINE: Preg Test, Ur: NEGATIVE

## 2020-06-21 MED ORDER — PANTOPRAZOLE SODIUM 20 MG PO TBEC: 20.0000 mg | DELAYED_RELEASE_TABLET | Freq: Every day | ORAL | 0 refills | Status: DC

## 2020-06-21 MED ORDER — ALUM & MAG HYDROXIDE-SIMETH 200-200-20 MG/5ML PO SUSP
15.0000 mL | Freq: Once | ORAL | Status: AC
  Administered 2020-06-21: 15 mL via ORAL
  Filled 2020-06-21: qty 30

## 2020-06-21 MED ORDER — ONDANSETRON 4 MG PO TBDP: 4.0000 mg | ORAL_TABLET | Freq: Three times a day (TID) | ORAL | 0 refills | Status: DC | PRN

## 2020-06-21 MED ORDER — LIDOCAINE VISCOUS HCL 2 % MT SOLN
15.0000 mL | Freq: Once | OROMUCOSAL | Status: AC
  Administered 2020-06-21: 15 mL via OROMUCOSAL
  Filled 2020-06-21: qty 15

## 2020-06-21 MED ORDER — FAMOTIDINE IN NACL 20-0.9 MG/50ML-% IV SOLN
20.0000 mg | Freq: Once | INTRAVENOUS | Status: AC
  Administered 2020-06-21: 20 mg via INTRAVENOUS
  Filled 2020-06-21: qty 50

## 2020-06-21 MED ORDER — IOHEXOL 300 MG/ML  SOLN
100.0000 mL | Freq: Once | INTRAMUSCULAR | Status: AC | PRN
  Administered 2020-06-21: 100 mL via INTRAVENOUS

## 2020-06-21 NOTE — Discharge Instructions (Signed)
Your work-up has been reassuring in the emergency department today.  Your blood work is normal.  Your CAT scan shows a incidental finding of a left ovarian cyst that is likely hemorrhagic.  If you develop any worsening lower abdominal pain that then you may want to have follow-up for an ultrasound if not just have a regular follow-up with your OB/GYN.  Your work-up also reveals no signs of any cardiac or heart problems causing your symptoms.  Your symptoms may be secondary to an ulcer in your stomach or your intestines.  I do want you to start taking a proton pump inhibitor daily with food.  You can also take Pepcid daily as well.  I would avoid taking NSAIDs on an empty stomach.  The next 24 hours clear liquid diet and advance your diet as tolerated with bland foods.  If your symptoms persist you may need follow-up to a gastrointestinal doctor.  If you develop any worsening pain, vomiting return to the ER.

## 2020-06-21 NOTE — ED Notes (Signed)
Pt returned from CT and ambulated to RR

## 2020-06-21 NOTE — ED Notes (Signed)
ED Provider at bedside. 

## 2020-06-21 NOTE — ED Provider Notes (Signed)
[facility] EMERGENCY DEPARTMENT Provider Note   CSN: [id number] Arrival date & time: 06/21/20  3295     History Chief Complaint  Patient presents with  . Abdominal Pain  . Chest Pain    Molly Gibbs is a 30 y.o. female.  HPI 30 year old female presents to the emergency department today with a past medical history significant for GERD, asthma for evaluation of abdominal pain.  Patient reports that yesterday she started having some cramping pain to her upper abdomen.  Patient reports it became sharp and radiates to her left upper back.  Patient reports the pain has become constant.  She reports nausea without vomiting.  No diarrhea or constipation.  Rates the pain a 7/10 at this time.  She has taken no medications for symptoms prior to arrival.  She denies any history of PE/DVT, prolonged immobilization, recent hospitalization/surgeries, unilateral leg swelling or calf tenderness, hemoptysis.  Patient denies any oral contraceptive use.  She reports up until 3 months ago she was using NSAIDs regularly.  Reports prior history of C-section.  No cardiac history the patient has had atypical chest pains in the same cardiology in the past and uses nitroglycerin as needed.  No fever, chills, cough.    Past Medical History:  Diagnosis Date  . Anxiety   . Asthma   . Blood transfusion without reported diagnosis   . Depression   . GERD (gastroesophageal reflux disease)     Patient Active Problem List   Diagnosis Date Noted  . Atypical chest pain 07/27/2019  . Mixed hyperlipidemia 07/27/2019  . Tobacco use 04/09/2019  . Dyslipidemia (high LDL; low HDL) 04/09/2019  . Obesity (BMI 30-39.9) 04/09/2019  . Chest pain of uncertain etiology 04/09/2019  . Palpitations 04/09/2019  . Knee pain 06/27/2012  . Asthma 06/27/2011    Past Surgical History:  Procedure Laterality Date  . CESAREAN SECTION    . DILATION AND EVACUATION N/A 10/29/2012   Procedure: DILATATION AND EVACUATION WITH  ULTRASOUND;  Surgeon: Alphonsus Sias. Ernestina Penna, MD;  Location: [facility];  Service: Gynecology;  Laterality: N/A;  CHROMOSOME STUDIES;POSSIBLE MOLAR PREGNANCY.   Bladder instillation performed     OB History    Gravida  2   Para  1   Term  1   Preterm  0   AB  0   Living  1     SAB  0   IAB  0   Ectopic  0   Multiple  0   Live Births              Family History  Problem Relation Age of Onset  . Arthritis Maternal Grandmother        Rheumatoid arthritis  . Breast cancer Maternal Grandmother        breast  . Heart disease Maternal Grandmother   . Arthritis Maternal Grandfather   . Arthritis Paternal Grandmother        Osteoarthritis  . Heart disease Paternal Grandmother   . Atrial fibrillation Paternal Grandmother   . Arthritis Paternal Grandfather        Osteoarthritis  . Lung cancer Paternal Grandfather        lung    Social History   Tobacco Use  . Smoking status: Current Every Day Smoker    Packs/day: 0.50    Years: 9.00    Pack years: 4.50    Types: Cigarettes  . Smokeless tobacco: Never Used  Vaping Use  . Vaping Use: Never used  Substance Use Topics  . Alcohol use: Yes    Comment: occasionally 1 drink weekly  . Drug use: No    Home Medications Prior to Admission medications   Medication Sig Start Date End Date Taking? Authorizing Provider  FLUoxetine (PROZAC) 20 MG tablet TAKE 1 TABLET BY MOUTH EVERY DAY 06/12/20  Yes Janeece Agee, NP  Aspirin-Acetaminophen-Caffeine (EXCEDRIN MIGRAINE PO) Take 1 tablet by mouth daily.    [provider]  EPINEPHrine (EPIPEN) 0.3 mg/0.3 mL SOAJ injection Inject 0.3 mLs (0.3 mg total) into the muscle once. 07/03/13   Jonita Albee, MD  ibuprofen (ADVIL,MOTRIN) 200 MG tablet Take 400 mg by mouth every 6 (six) hours as needed for pain (for migraines).    [provider]  nitroGLYCERIN (NITROSTAT) 0.4 MG SL tablet Place 1 tablet (0.4 mg total) under the tongue every 5 (five) minutes as needed for  chest pain. 03/05/19 06/03/19  Tobb, Lavona Mound, DO    Allergies    Other, Codeine, and Imitrex [sumatriptan]  Review of Systems   Review of Systems  Constitutional: Negative for chills and fever.  HENT: Negative for congestion.   Eyes: Negative for discharge.  Respiratory: Positive for shortness of breath. Negative for cough.   Cardiovascular: Positive for chest pain. Negative for palpitations and leg swelling.  Gastrointestinal: Positive for abdominal pain and nausea. Negative for blood in stool, constipation, diarrhea and vomiting.  Genitourinary: Negative for difficulty urinating.  Musculoskeletal: Positive for back pain. Negative for myalgias.  Skin: Negative for color change.  Neurological: Negative for headaches.  Psychiatric/Behavioral: Negative for confusion.    Physical Exam Updated Vital Signs BP (!) 145/90 (BP Location: Right Arm)   Pulse 85   Temp 98.3 F (36.8 C) (Oral)   Resp 18   Ht 5\' 3"  (1.6 m)   Wt 69.4 kg   LMP 06/08/2020   SpO2 99%   BMI 27.10 kg/m   Physical Exam Vitals and nursing note reviewed.  Constitutional:      General: She is not in acute distress.    Appearance: She is well-developed and well-nourished. She is not ill-appearing, toxic-appearing or diaphoretic.  HENT:     Head: Normocephalic and atraumatic.     Nose: Nose normal.     Mouth/Throat:     Mouth: Oropharynx is clear and moist.  Eyes:     General:        Right eye: No discharge.        Left eye: No discharge.     Conjunctiva/sclera: Conjunctivae normal.     Pupils: Pupils are equal, round, and reactive to light.  Neck:     Vascular: No JVD.     Trachea: No tracheal deviation.  Cardiovascular:     Rate and Rhythm: Normal rate and regular rhythm.     Pulses: Intact distal pulses.     Heart sounds: Normal heart sounds. No murmur heard. No friction rub. No gallop.   Pulmonary:     Effort: Pulmonary effort is normal. No respiratory distress.     Breath sounds: Normal breath  sounds.     Comments: No hypoxia or tachypnea. Chest:     Chest wall: No tenderness.  Abdominal:     General: Bowel sounds are normal. There is no distension.     Palpations: Abdomen is soft.     Tenderness: There is abdominal tenderness in the epigastric area. There is no right CVA tenderness, left CVA tenderness, guarding or rebound. Negative signs include Murphy's sign, Rovsing's  sign and McBurney's sign.  Musculoskeletal:        General: Normal range of motion.     Cervical back: Normal range of motion and neck supple.     Comments: No lower extremity edema or calf tenderness.  Lymphadenopathy:     Cervical: No cervical adenopathy.  Skin:    General: Skin is warm and dry.     Capillary Refill: Capillary refill takes less than 2 seconds.  Neurological:     Mental Status: She is alert and oriented to person, place, and time.  Psychiatric:        Mood and Affect: Mood normal.        Behavior: Behavior normal.        Thought Content: Thought content normal.        Judgment: Judgment normal.     ED Results / Procedures / Treatments   Labs (all labs ordered are listed, but only abnormal results are displayed) Labs Reviewed  COMPREHENSIVE METABOLIC PANEL - Abnormal; Notable for the following components:      Result Value   Total Bilirubin 0.2 (*)    All other components within normal limits  LIPASE, BLOOD  CBC  URINALYSIS, ROUTINE W REFLEX MICROSCOPIC  PREGNANCY, URINE  TROPONIN I (HIGH SENSITIVITY)  TROPONIN I (HIGH SENSITIVITY)    EKG None  Radiology CT ABDOMEN PELVIS W CONTRAST  Result Date: 06/21/2020 CLINICAL DATA:  Epigastric region pain with nausea EXAM: CT ABDOMEN AND PELVIS WITH CONTRAST TECHNIQUE: Multidetector CT imaging of the abdomen and pelvis was performed using the standard protocol following bolus administration of intravenous contrast. Oral contrast was also administered. CONTRAST:  100mL OMNIPAQUE IOHEXOL 300 MG/ML  SOLN COMPARISON:  None. FINDINGS:  Lower chest: The lung bases are clear. Hepatobiliary: There is a probable hemangioma at the dome of the liver on the right measuring 8 x 7 mm. There is mild fatty infiltration near the fissure for the ligamentum teres. No other focal liver lesions are evident. Gallbladder wall is not appreciably thickened. There is no biliary duct dilatation. Pancreas: There is no pancreatic mass or inflammatory focus. Spleen: No splenic lesions are evident. Adrenals/Urinary Tract: Adrenals bilaterally appear normal. There is a cyst arising from the mid right kidney laterally measuring 2.4 x 2.1 cm. There is no appreciable hydronephrosis on either side. There is no appreciable renal or ureteral calculus on either side. The urinary bladder is midline with wall thickness within normal limits. Stomach/Bowel: There is fairly diffuse stool throughout much of the colon. There is no appreciable bowel wall or mesenteric thickening. Note that the cecum extends to the midline. Terminal ileum appears unremarkable. No evident bowel obstruction. There is no free air or portal venous air. Vascular/Lymphatic: No abdominal aortic aneurysm. No arterial vascular lesions are evident. Major venous structures appear patent. There is no evident adenopathy in the abdomen or pelvis. Reproductive: The uterus is retroverted. There is a small mass in the left ovary measuring 2.3 x 1.8 cm which has attenuation values slightly higher than is expected with a simple cyst. Question hemorrhagic follicle/cyst in this area. No other adnexal lesion evident. Other: The appendix extends to the left of midline. Appendix is normal in size and configuration. There is no abscess or ascites evident in the abdomen or pelvis. Musculoskeletal: No blastic or lytic bone lesions. No abdominal wall or intramuscular lesions are evident. IMPRESSION: 1. Suspect hemorrhagic cyst/follicle arising from left ovary measuring 2.3 x 1.8 cm. This finding may warrant correlation with pelvic  ultrasound.  No other adnexal mass evident. Uterus is retroverted. 2. There is fairly diffuse stool throughout the colon. No evident bowel wall thickening. No bowel obstruction. No abscess in the abdomen or pelvis. No appendiceal inflammation evident. 3. No renal or ureteral calculi. No hydronephrosis. Urinary bladder wall thickness within normal limits. Electronically Signed   By: Bretta Bang III M.D.   On: 06/21/2020 11:19    Procedures Procedures   Medications Ordered in ED Medications - No data to display  ED Course  I have reviewed the triage vital signs and the nursing notes.  Pertinent labs & imaging results that were available during my care of the patient were reviewed by me and considered in my medical decision making (see chart for details).    MDM Rules/Calculators/A&P                          30 year old female presents the emergency department today for evaluation of epigastric abdominal discomfort since yesterday.  She reports associated nausea and pain to her left upper back and shoulder.  She reports some mild shortness of breath.  Patient is overall well-appearing on exam.  Vital signs are reassuring.  She has some mild focal abdominal pain to the epigastric region.  Otherwise exam is reassuring.  Differential diagnosis includes enteritis, GERD, PUD, pancreatitis, cholecystitis.  I doubt any cardiac or pulmonary abnormalities.  Patient is PERC negative.  Low suspicion for PE.  EKG was performed which I personally reviewed shows normal sinus rhythm without any acute ST elevation or depression.  Labs reviewed.  Patient has no leukocytosis.  Lipase is normal.  Normal liver enzymes and kidney function.  Troponin was less than 3 and normal.  Pregnancy test was negative.  UA without concern for infection.  CT scan was performed that shows no acute abnormalities.  Patient does have a left ovarian cyst that is likely hemorrhagic measuring 2 cm.  She has no focal pain to the lower  quadrant.  I have low suspicion for ovarian torsion at this time.  This is likely an incidental finding.  Patient also has stool seen throughout the colon which could play a role in patient's symptoms with constipation.  Patient encouraged to take MiraLAX and stool softeners at home.  Patient's pain does seem consistent with possible peptic ulcer disease.  She has been on chronic NSAIDs in the past.  I encouraged her to discontinue use of NSAIDs and to take with a full stomach.  Patient presentation is also not consistent with ACS or dissection.  Patient encouraged use of PPI at home and have given primary care and GI follow-up.  I discussed reasons that she should return to the ER immediately.  Pt is hemodynamically stable, in NAD, & able to ambulate in the ED. Evaluation does not show pathology that would require ongoing emergent intervention or inpatient treatment. I explained the diagnosis to the patient. Pain has been managed & has no complaints prior to dc. Pt is comfortable with above plan and is stable for discharge at this time. All questions were answered prior to disposition. Strict return precautions for f/u to the ED were discussed. Encouraged follow up with PCP.  Final Clinical Impression(s) / ED Diagnoses Final diagnoses:  Epigastric pain    Rx / DC Orders ED Discharge Orders         Ordered    ondansetron (ZOFRAN ODT) 4 MG disintegrating tablet  Every 8 hours PRN,   Status:  Discontinued        06/21/20 1134    pantoprazole (PROTONIX) 20 MG tablet  Daily,   Status:  Discontinued        06/21/20 1134    ondansetron (ZOFRAN ODT) 4 MG disintegrating tablet  Every 8 hours PRN,   Status:  Discontinued        06/21/20 1138    pantoprazole (PROTONIX) 20 MG tablet  Daily,   Status:  Discontinued        06/21/20 1138    ondansetron (ZOFRAN ODT) 4 MG disintegrating tablet  Every 8 hours PRN        06/21/20 1139    pantoprazole (PROTONIX) 20 MG tablet  Daily        06/21/20 1139            Wallace KellerLeaphart, Tiffeny Minchew T, PA-C 06/21/20 1141    Pricilla LovelessGoldston, Scott, MD 06/23/20 [phone]

## 2020-06-21 NOTE — ED Notes (Signed)
Pt on monitor and vitals cycling 

## 2020-06-21 NOTE — ED Triage Notes (Signed)
Pt arrives stating she had mid abdominal cramping yesterday. Last night had epigastric pain that is shooting to chest and upper left back. States pain is sharp and constant, c/o nausea

## 2020-06-21 NOTE — ED Notes (Signed)
Pt ambulatory to room, changing to gown

## 2020-06-27 ENCOUNTER — Encounter: Payer: Self-pay | Admitting: Registered Nurse

## 2020-06-27 ENCOUNTER — Other Ambulatory Visit: Payer: Self-pay

## 2020-06-27 ENCOUNTER — Ambulatory Visit: Payer: BC Managed Care – PPO | Admitting: Registered Nurse

## 2020-06-27 VITALS — BP 100/68 | HR 89 | Temp 97.6°F | Resp 18 | Ht 63.0 in | Wt 155.6 lb

## 2020-06-27 DIAGNOSIS — N83209 Unspecified ovarian cyst, unspecified side: Secondary | ICD-10-CM | POA: Diagnosis not present

## 2020-06-27 DIAGNOSIS — Z09 Encounter for follow-up examination after completed treatment for conditions other than malignant neoplasm: Secondary | ICD-10-CM

## 2020-06-27 DIAGNOSIS — R1013 Epigastric pain: Secondary | ICD-10-CM

## 2020-06-27 MED ORDER — SUCRALFATE 1 G PO TABS: 1.0000 g | ORAL_TABLET | Freq: Three times a day (TID) | ORAL | 0 refills | Status: DC

## 2020-06-27 NOTE — Patient Instructions (Signed)
° ° ° °  If you have lab work done today you will be contacted with your lab results within the next 2 weeks.  If you have not heard from us then please contact us. The fastest way to get your results is to register for My Chart. ° ° °IF you received an x-ray today, you will receive an invoice from [facility]. Please contact [facility] at [phone] with questions or concerns regarding your invoice.  ° °IF you received labwork today, you will receive an invoice from [facility]. Please contact [facility] at [phone] with questions or concerns regarding your invoice.  ° °Our billing staff will not be able to assist you with questions regarding bills from these companies. ° °You will be contacted with the lab results as soon as they are available. The fastest way to get your results is to activate your My Chart account. Instructions are located on the last page of this paperwork. If you have not heard from us regarding the results in 2 weeks, please contact this office. °  ° ° ° °

## 2020-06-27 NOTE — Progress Notes (Signed)
Established Patient Office Visit  Subjective:  Patient ID: Molly Gibbs, female    DOB: 05/09/1991  Age: 30 y.o. MRN: [id number]  CC:  Chief Complaint  Patient presents with  . Hospitalization Follow-up    Patient states she went to the hospital last week for abdominal pain and would like to discuss a cyst they found. Per patient she was given a antibiotic that helped some     HPI Molly Gibbs presents for hfu  Seen on 06/21/20 for epigastric pain Was given pantoprazole and zofran, pain has improved but not resolved. Labs wnl CT abd and pelvis w contrast showed incidental finding of ovarian cyst, likely hemorrhagic Pt has long hx of ovarian cysts. occ pain. No new bleeding, fevers, abd tenderness, urinary symptoms, or changes to bowel movements.  Interested in next steps, discussing lab work  Past Medical History:  Diagnosis Date  . Anxiety   . Asthma   . Blood transfusion without reported diagnosis   . Depression   . GERD (gastroesophageal reflux disease)     Past Surgical History:  Procedure Laterality Date  . CESAREAN SECTION    . DILATION AND EVACUATION N/A 10/29/2012   Procedure: DILATATION AND EVACUATION WITH ULTRASOUND;  Surgeon: Molly Gibbs. Molly Penna, MD;  Location: [facility];  Service: Gynecology;  Laterality: N/A;  CHROMOSOME STUDIES;POSSIBLE MOLAR PREGNANCY.   Bladder instillation performed    Family History  Problem Relation Age of Onset  . Arthritis Maternal Grandmother        Rheumatoid arthritis  . Breast cancer Maternal Grandmother        breast  . Heart disease Maternal Grandmother   . Arthritis Maternal Grandfather   . Arthritis Paternal Grandmother        Osteoarthritis  . Heart disease Paternal Grandmother   . Atrial fibrillation Paternal Grandmother   . Arthritis Paternal Grandfather        Osteoarthritis  . Lung cancer Paternal Grandfather        lung    Social History   Socioeconomic History  . Marital status: Married    Spouse name: Not  on file  . Number of children: 1  . Years of education: College  . Highest education level: Not on file  Occupational History    Employer: [employer]    Comment: [employer]  Tobacco Use  . Smoking status: Current Every Day Smoker    Packs/day: 0.50    Years: 9.00    Pack years: 4.50    Types: Cigarettes  . Smokeless tobacco: Never Used  Vaping Use  . Vaping Use: Never used  Substance and Sexual Activity  . Alcohol use: Yes    Comment: occasionally 1 drink weekly  . Drug use: No  . Sexual activity: Yes    Birth control/protection: Inserts  Other Topics Concern  . Not on file  Social History Narrative   Patient lives at home with her daughter.   Caffeine Use: 2 sodas daily   Social Determinants of Health   Financial Resource Strain: Not on file  Food Insecurity: Not on file  Transportation Needs: Not on file  Physical Activity: Not on file  Stress: Not on file  Social Connections: Not on file  Intimate Partner Violence: Not on file    Outpatient Medications Prior to Visit  Medication Sig Dispense Refill  . Aspirin-Acetaminophen-Caffeine (EXCEDRIN MIGRAINE PO) Take 1 tablet by mouth daily.    Marland Kitchen EPINEPHrine (EPIPEN) 0.3 mg/0.3 mL SOAJ injection Inject 0.3 mLs (0.3  mg total) into the muscle once. 2 Device 3  . FLUoxetine (PROZAC) 20 MG tablet TAKE 1 TABLET BY MOUTH EVERY DAY 90 tablet 0  . ibuprofen (ADVIL,MOTRIN) 200 MG tablet Take 400 mg by mouth every 6 (six) hours as needed for pain (for migraines).    . ondansetron (ZOFRAN ODT) 4 MG disintegrating tablet Take 1 tablet (4 mg total) by mouth every 8 (eight) hours as needed for nausea or vomiting. 8 tablet 0  . pantoprazole (PROTONIX) 20 MG tablet Take 1 tablet (20 mg total) by mouth daily. 14 tablet 0  . nitroGLYCERIN (NITROSTAT) 0.4 MG SL tablet Place 1 tablet (0.4 mg total) under the tongue every 5 (five) minutes as needed for chest pain. 90 tablet 3   No facility-administered medications prior to visit.     Allergies  Allergen Reactions  . Other Anaphylaxis and Swelling    Peppermint Oil  . Codeine     swelling  . Imitrex [Sumatriptan] Swelling    tongue    ROS Review of Systems  Constitutional: Negative.   HENT: Negative.   Eyes: Negative.   Respiratory: Negative.   Cardiovascular: Negative.   Gastrointestinal: Negative.   Genitourinary: Negative.   Musculoskeletal: Negative.   Skin: Negative.   Neurological: Negative.   Psychiatric/Behavioral: Negative.   All other systems reviewed and are negative.     Objective:    Physical Exam Vitals and nursing note reviewed.  Constitutional:      General: She is not in acute distress.    Appearance: Normal appearance. She is normal weight. She is not ill-appearing, toxic-appearing or diaphoretic.  Cardiovascular:     Rate and Rhythm: Normal rate and regular rhythm.     Heart sounds: Normal heart sounds. No murmur heard. No friction rub. No gallop.   Pulmonary:     Effort: Pulmonary effort is normal. No respiratory distress.     Breath sounds: Normal breath sounds. No stridor. No wheezing, rhonchi or rales.  Chest:     Chest wall: No tenderness.  Skin:    General: Skin is warm and dry.  Neurological:     General: No focal deficit present.     Mental Status: She is alert and oriented to person, place, and time. Mental status is at baseline.  Psychiatric:        Mood and Affect: Mood normal.        Behavior: Behavior normal.        Thought Content: Thought content normal.        Judgment: Judgment normal.     BP 100/68   Pulse 89   Temp 97.6 F (36.4 C) (Temporal)   Resp 18   Ht 5\' 3"  (1.6 m)   Wt 155 lb 9.6 oz (70.6 kg)   LMP 06/08/2020 Comment: neg upreg today in er. rr  SpO2 99%   BMI 27.56 kg/m  Wt Readings from Last 3 Encounters:  06/27/20 155 lb 9.6 oz (70.6 kg)  06/21/20 153 lb (69.4 kg)  04/25/20 158 lb (71.7 kg)     There are no preventive care reminders to display for this patient.  There  are no preventive care reminders to display for this patient.  Lab Results  Component Value Date   TSH 1.530 03/21/2020   Lab Results  Component Value Date   WBC 6.7 06/21/2020   HGB 13.4 06/21/2020   HCT 39.0 06/21/2020   MCV 91.5 06/21/2020   PLT 225 06/21/2020   Lab Results  Component Value Date   NA 138 06/21/2020   K 3.6 06/21/2020   CO2 24 06/21/2020   GLUCOSE 94 06/21/2020   BUN 14 06/21/2020   CREATININE 0.58 06/21/2020   BILITOT 0.2 (L) 06/21/2020   ALKPHOS 51 06/21/2020   AST 19 06/21/2020   ALT 16 06/21/2020   PROT 7.4 06/21/2020   ALBUMIN 4.3 06/21/2020   CALCIUM 9.0 06/21/2020   ANIONGAP 10 06/21/2020   Lab Results  Component Value Date   CHOL 134 03/21/2020   Lab Results  Component Value Date   HDL 38 (L) 03/21/2020   Lab Results  Component Value Date   LDLCALC 84 03/21/2020   Lab Results  Component Value Date   TRIG 57 03/21/2020   Lab Results  Component Value Date   CHOLHDL 3.5 03/21/2020   Lab Results  Component Value Date   HGBA1C 5.1 03/21/2020      Assessment & Plan:   Problem List Items Addressed This Visit   None   Visit Diagnoses    Cyst of ovary, unspecified laterality    -  Primary   Relevant Orders   US Pelvic Complete With Transvaginal   Abdominal pain, epigastric       Relevant Medications   sucralfate (CARAFATE) 1 g tablet      Meds ordered this encounter  Medications  . sucralfate (CARAFATE) 1 g tablet    Sig: Take 1 tablet (1 g total) by mouth 4 (four) times daily -  with meals and at bedtime.    Dispense:  40 tablet    Refill:  0    Order Specific Question:   Supervising Provider    Answer:   Neva Seat, JEFFREY R [[id number]]    Follow-up: No follow-ups on file.   PLAN  Add sucralfate for epigastric pain. Can consider h pylori testing if no continued improvement. Will likely pursue serology rather than breath testing  US pelvis to correlate findings of cyst on CT. Discussed reasons for concern  Patient  encouraged to call clinic with any questions, comments, or concerns.  Janeece Agee, NP

## 2020-06-30 DIAGNOSIS — F4323 Adjustment disorder with mixed anxiety and depressed mood: Secondary | ICD-10-CM | POA: Diagnosis not present

## 2020-07-07 ENCOUNTER — Ambulatory Visit
Admission: RE | Admit: 2020-07-07 | Discharge: 2020-07-07 | Disposition: A | Discharge status: Home / Self Care | Payer: BC Managed Care – PPO | Source: Ambulatory Visit | Attending: Registered Nurse | Admitting: Registered Nurse

## 2020-07-07 DIAGNOSIS — N83209 Unspecified ovarian cyst, unspecified side: Secondary | ICD-10-CM

## 2020-07-07 DIAGNOSIS — R102 Pelvic and perineal pain: Secondary | ICD-10-CM | POA: Diagnosis not present

## 2020-07-28 DIAGNOSIS — F4323 Adjustment disorder with mixed anxiety and depressed mood: Secondary | ICD-10-CM | POA: Diagnosis not present

## 2020-08-30 ENCOUNTER — Other Ambulatory Visit: Payer: Self-pay | Admitting: Cardiology

## 2020-08-30 NOTE — Telephone Encounter (Signed)
Nitroglycerin approved and sent with instructions to arrange and appt with Dr. Servando Salina for additional refills. 1 rst attempt

## 2020-09-14 ENCOUNTER — Other Ambulatory Visit: Payer: Self-pay | Admitting: Registered Nurse

## 2020-09-14 DIAGNOSIS — F419 Anxiety disorder, unspecified: Secondary | ICD-10-CM

## 2020-10-17 ENCOUNTER — Telehealth: Payer: BC Managed Care – PPO | Admitting: Physician Assistant

## 2020-10-17 DIAGNOSIS — R42 Dizziness and giddiness: Secondary | ICD-10-CM

## 2020-10-17 DIAGNOSIS — U071 COVID-19: Secondary | ICD-10-CM

## 2020-10-17 NOTE — Progress Notes (Signed)
Based on what you have shared with me, you need to seek an "in-person" evaluation for symptoms related to Covid 19.  Dizziness can be from a number of complications with COVID.    Our Emergency Departments are best equipped to handle patients with severe symptoms.    [facility] Emergency Department [address] [place], [place], [place] [address] [phone]  . [facility] Emergency Department [address] [place], [place] [address] [phone]  . [facility] Emergency Department [address] [place], [place], [place] [address] [phone]  . [facility] Emergency Department [address] [place], [place], [place] [address] [phone]  . [facility] Emergency Department [address] [place], [place], [place] [address] [phone]   If you are having a true medical emergency please call 911.  NOTE: If you entered your credit card information for this eVisit, you will not be charged. You may see a "hold" on your card for the $35 but that hold will drop off and you will not have a charge processed.   Your e-visit answers were reviewed by a board certified advanced clinical practitioner to complete your personal care plan.  Thank you for using e-Visits  Greater than 5 minutes, yet less than 10 minutes of time have been spent researching, coordinating, and implementing care for this patient today

## 2020-10-18 ENCOUNTER — Telehealth (INDEPENDENT_AMBULATORY_CARE_PROVIDER_SITE_OTHER): Payer: BC Managed Care – PPO | Admitting: Registered Nurse

## 2020-10-18 ENCOUNTER — Other Ambulatory Visit: Payer: Self-pay

## 2020-10-18 ENCOUNTER — Encounter: Payer: Self-pay | Admitting: Registered Nurse

## 2020-10-18 VITALS — BP 104/71 | Temp 98.0°F | Wt 157.0 lb

## 2020-10-18 DIAGNOSIS — R42 Dizziness and giddiness: Secondary | ICD-10-CM

## 2020-10-18 DIAGNOSIS — U071 COVID-19: Secondary | ICD-10-CM

## 2020-10-18 DIAGNOSIS — R110 Nausea: Secondary | ICD-10-CM

## 2020-10-18 MED ORDER — MECLIZINE HCL 12.5 MG PO TABS: 12.5000 mg | ORAL_TABLET | Freq: Three times a day (TID) | ORAL | 0 refills | Status: DC | PRN

## 2020-10-18 MED ORDER — ONDANSETRON 4 MG PO TBDP: 4.0000 mg | ORAL_TABLET | Freq: Three times a day (TID) | ORAL | 0 refills | Status: DC | PRN

## 2020-10-18 NOTE — Patient Instructions (Signed)
° ° ° °  If you have lab work done today you will be contacted with your lab results within the next 2 weeks.  If you have not heard from us then please contact us. The fastest way to get your results is to register for My Chart. ° ° °IF you received an x-ray today, you will receive an invoice from [facility]. Please contact  Radiology at [phone] with questions or concerns regarding your invoice.  ° °IF you received labwork today, you will receive an invoice from [facility]. Please contact [facility] at [phone] with questions or concerns regarding your invoice.  ° °Our billing staff will not be able to assist you with questions regarding bills from these companies. ° °You will be contacted with the lab results as soon as they are available. The fastest way to get your results is to activate your My Chart account. Instructions are located on the last page of this paperwork. If you have not heard from us regarding the results in 2 weeks, please contact this office. °  ° ° ° °

## 2020-10-18 NOTE — Progress Notes (Signed)
Telemedicine Encounter- SOAP NOTE Established Patient  This telephone encounter was conducted with the patient's (or proxy's) verbal consent via audio telecommunications: yes  Patient was instructed to have this encounter in a suitably private space; and to only have persons present to whom they give permission to participate. In addition, patient identity was confirmed by use of name plus two identifiers (DOB and address).  I discussed the limitations, risks, security and privacy concerns of performing an evaluation and management service by telephone and the availability of in person appointments. I also discussed with the patient that there may be a patient responsible charge related to this service. The patient expressed understanding and agreed to proceed.  I spent a total of 16 minutes talking with the patient or their proxy.  Patient at home Provider in office  Participants Molly Sportsman, NP and [name]  Chief Complaint  Patient presents with  . Covid Positive    Patient states she tested positive for covid last Thursday. Patient states the only symptom she is experiencing is dizziness and vertigo. She states she was taking tylenol and ibuprofen for the body aches and fever but have not took anything in the last 24 hours.    Subjective   Molly Gibbs is a 30 y.o. established patient. Telephone visit today for dizziness  HPI Covid positive as of last Thursday No ongoing fever, chills, fatigue. Has improved in almost all aspects  Still ongoing dizziness, feels like she cannot walk in a straight line.  Some nausea, no vomiting.  Poor appetite.  No visual changes. Mild headaches but not "worst of life" or thunderclap.   Had been taking tylenol and ibuprofen for relief but has stopped this - at max 400mg  tylenol each day Had done mucinex over the weekend.  Patient Active Problem List   Diagnosis Date Noted  . Atypical chest pain 07/27/2019  . Mixed hyperlipidemia  07/27/2019  . Tobacco use 04/09/2019  . Dyslipidemia (high LDL; low HDL) 04/09/2019  . Obesity (BMI 30-39.9) 04/09/2019  . Chest pain of uncertain etiology 04/09/2019  . Palpitations 04/09/2019  . Knee pain 06/27/2012  . Asthma 06/27/2011    Past Medical History:  Diagnosis Date  . Anxiety   . Asthma   . Blood transfusion without reported diagnosis   . Depression   . GERD (gastroesophageal reflux disease)     Current Outpatient Medications  Medication Sig Dispense Refill  . Aspirin-Acetaminophen-Caffeine (EXCEDRIN MIGRAINE PO) Take 1 tablet by mouth daily.    06/29/2011 EPINEPHrine (EPIPEN) 0.3 mg/0.3 mL SOAJ injection Inject 0.3 mLs (0.3 mg total) into the muscle once. 2 Device 3  . FLUoxetine (PROZAC) 20 MG tablet TAKE 1 TABLET BY MOUTH EVERY DAY 90 tablet 3  . ibuprofen (ADVIL,MOTRIN) 200 MG tablet Take 400 mg by mouth every 6 (six) hours as needed for pain (for migraines).    . sucralfate (CARAFATE) 1 g tablet Take 1 tablet (1 g total) by mouth 4 (four) times daily -  with meals and at bedtime. 40 tablet 0  . nitroGLYCERIN (NITROSTAT) 0.4 MG SL tablet PLACE 1 TABLET (0.4 MG TOTAL) UNDER THE TONGUE EVERY 5 (FIVE) MINUTES AS NEEDED FOR CHEST PAIN. (Patient not taking: Reported on 10/18/2020) 25 tablet 0  . ondansetron (ZOFRAN ODT) 4 MG disintegrating tablet Take 1 tablet (4 mg total) by mouth every 8 (eight) hours as needed for nausea or vomiting. (Patient not taking: Reported on 10/18/2020) 8 tablet 0  . pantoprazole (PROTONIX) 20 MG tablet  Take 1 tablet (20 mg total) by mouth daily. (Patient not taking: Reported on 10/18/2020) 14 tablet 0   No current facility-administered medications for this visit.    Allergies  Allergen Reactions  . Other Anaphylaxis and Swelling    Peppermint Oil  . Codeine     swelling  . Imitrex [Sumatriptan] Swelling    tongue    Social History   Socioeconomic History  . Marital status: Married    Spouse name: Not on file  . Number of children: 1  .  Years of education: College  . Highest education level: Not on file  Occupational History    Employer: [employer]    Comment: [employer]  Tobacco Use  . Smoking status: Current Every Day Smoker    Packs/day: 0.50    Years: 9.00    Pack years: 4.50    Types: Cigarettes  . Smokeless tobacco: Never Used  Vaping Use  . Vaping Use: Never used  Substance and Sexual Activity  . Alcohol use: Yes    Comment: occasionally 1 drink weekly  . Drug use: No  . Sexual activity: Yes    Birth control/protection: Inserts  Other Topics Concern  . Not on file  Social History Narrative   Patient lives at home with her daughter.   Caffeine Use: 2 sodas daily   Social Determinants of Health   Financial Resource Strain: Not on file  Food Insecurity: Not on file  Transportation Needs: Not on file  Physical Activity: Not on file  Stress: Not on file  Social Connections: Not on file  Intimate Partner Violence: Not on file    ROS Per hpi   Objective   Vitals as reported by the patient: Today's Vitals   10/18/20 1026  BP: 104/71  Temp: 98 F (36.7 C)  TempSrc: Temporal  Weight: 157 lb (71.2 kg)    [name] was seen today for covid positive.  Diagnoses and all orders for this visit:  COVID-19  Dizziness -     meclizine (ANTIVERT) 12.5 MG tablet; Take 1 tablet (12.5 mg total) by mouth 3 (three) times daily as needed for dizziness.  Nausea without vomiting -     ondansetron (ZOFRAN ODT) 4 MG disintegrating tablet; Take 1 tablet (4 mg total) by mouth every 8 (eight) hours as needed for nausea or vomiting.   PLAN  zofran and meclizine for symptom relief  Continue with supportive care  Return if worsening or failing to improve  Patient encouraged to call clinic with any questions, comments, or concerns.   I discussed the assessment and treatment plan with the patient. The patient was provided an opportunity to ask questions and all were answered. The patient agreed  with the plan and demonstrated an understanding of the instructions.   The patient was advised to call back or seek an in-person evaluation if the symptoms worsen or if the condition fails to improve as anticipated.  I provided 16 minutes of non-face-to-face time during this encounter.  Janeece Agee, NP  [facility]

## 2020-10-20 ENCOUNTER — Encounter: Payer: Self-pay | Admitting: Registered Nurse

## 2020-11-03 ENCOUNTER — Other Ambulatory Visit: Payer: Self-pay

## 2020-11-03 ENCOUNTER — Ambulatory Visit
Admission: RE | Admit: 2020-11-03 | Discharge: 2020-11-03 | Disposition: A | Discharge status: Home / Self Care | Payer: BC Managed Care – PPO | Source: Ambulatory Visit | Attending: Obstetrics | Admitting: Obstetrics

## 2020-11-03 ENCOUNTER — Other Ambulatory Visit: Payer: Self-pay | Admitting: Obstetrics

## 2020-11-03 DIAGNOSIS — N6321 Unspecified lump in the left breast, upper outer quadrant: Secondary | ICD-10-CM | POA: Diagnosis not present

## 2020-11-03 DIAGNOSIS — N6310 Unspecified lump in the right breast, unspecified quadrant: Secondary | ICD-10-CM

## 2020-11-03 DIAGNOSIS — F4323 Adjustment disorder with mixed anxiety and depressed mood: Secondary | ICD-10-CM | POA: Diagnosis not present

## 2020-12-28 DIAGNOSIS — Z3202 Encounter for pregnancy test, result negative: Secondary | ICD-10-CM | POA: Diagnosis not present

## 2020-12-28 DIAGNOSIS — M545 Low back pain, unspecified: Secondary | ICD-10-CM | POA: Diagnosis not present

## 2020-12-28 DIAGNOSIS — M5442 Lumbago with sciatica, left side: Secondary | ICD-10-CM | POA: Diagnosis not present

## 2021-01-26 ENCOUNTER — Ambulatory Visit: Payer: BC Managed Care – PPO | Admitting: Registered Nurse

## 2021-01-27 ENCOUNTER — Telehealth (INDEPENDENT_AMBULATORY_CARE_PROVIDER_SITE_OTHER): Payer: BC Managed Care – PPO | Admitting: Registered Nurse

## 2021-01-27 ENCOUNTER — Encounter: Payer: Self-pay | Admitting: Registered Nurse

## 2021-01-27 ENCOUNTER — Other Ambulatory Visit: Payer: Self-pay

## 2021-01-27 DIAGNOSIS — G479 Sleep disorder, unspecified: Secondary | ICD-10-CM

## 2021-01-27 DIAGNOSIS — F410 Panic disorder [episodic paroxysmal anxiety] without agoraphobia: Secondary | ICD-10-CM

## 2021-01-27 DIAGNOSIS — F32A Depression, unspecified: Secondary | ICD-10-CM | POA: Diagnosis not present

## 2021-01-27 DIAGNOSIS — F419 Anxiety disorder, unspecified: Secondary | ICD-10-CM

## 2021-01-27 MED ORDER — ALPRAZOLAM 0.25 MG PO TBDP: 0.2500 mg | ORAL_TABLET | Freq: Three times a day (TID) | ORAL | 0 refills | Status: DC | PRN

## 2021-01-27 MED ORDER — TRAZODONE HCL 50 MG PO TABS: 25.0000 mg | ORAL_TABLET | Freq: Every evening | ORAL | 1 refills | Status: DC | PRN

## 2021-01-27 MED ORDER — FLUOXETINE HCL 40 MG PO CAPS: 40.0000 mg | ORAL_CAPSULE | Freq: Every day | ORAL | 3 refills | Status: DC

## 2021-01-27 NOTE — Progress Notes (Signed)
Telemedicine Encounter- SOAP NOTE Established Patient  This video encounter was conducted with the patient's (or proxy's) verbal consent via audio telecommunications: yes/no: Yes Patient was instructed to have this encounter in a suitably private space; and to only have persons present to whom they give permission to participate. In addition, patient identity was confirmed by use of name plus two identifiers (DOB and address).  I discussed the limitations, risks, security and privacy concerns of performing an evaluation and management service by telephone and the availability of in person appointments. I also discussed with the patient that there may be a patient responsible charge related to this service. The patient expressed understanding and agreed to proceed.  I spent a total of 17 minutes talking with the patient or their proxy.  Patient at home Provider in office  Participants: Molly Sportsman, NP and Molly Gibbs  Chief Complaint  Patient presents with   Anxiety    Subjective   Molly Gibbs is a 30 y.o. established patient. Video visit today for anxiety follow up   HPI Last seen in Nov 2021 for this - fluoxetine 20mg  po qd Has had more stress lately, has increased to 40mg  po qd most days A lot of stress at work  Trouble sleeping due to stress Trouble falling asleep and staying asleep  Patient Active Problem List   Diagnosis Date Noted   Atypical chest pain 07/27/2019   Mixed hyperlipidemia 07/27/2019   Tobacco use 04/09/2019   Dyslipidemia (high LDL; low HDL) 04/09/2019   Obesity (BMI 30-39.9) 04/09/2019   Chest pain of uncertain etiology 04/09/2019   Palpitations 04/09/2019   Knee pain 06/27/2012   Asthma 06/27/2011    Past Medical History:  Diagnosis Date   Anxiety    Asthma    Blood transfusion without reported diagnosis    Depression    GERD (gastroesophageal reflux disease)     Current Outpatient Medications  Medication Sig Dispense Refill    ALPRAZolam (NIRAVAM) 0.25 MG dissolvable tablet Take 1 tablet (0.25 mg total) by mouth 3 (three) times daily as needed for anxiety. 30 tablet 0   Aspirin-Acetaminophen-Caffeine (EXCEDRIN MIGRAINE PO) Take 1 tablet by mouth daily.     EPINEPHrine (EPIPEN) 0.3 mg/0.3 mL SOAJ injection Inject 0.3 mLs (0.3 mg total) into the muscle once. 2 Device 3   FLUoxetine (PROZAC) 40 MG capsule Take 1 capsule (40 mg total) by mouth daily. 90 capsule 3   ibuprofen (ADVIL,MOTRIN) 200 MG tablet Take 400 mg by mouth every 6 (six) hours as needed for pain (for migraines).     ondansetron (ZOFRAN ODT) 4 MG disintegrating tablet Take 1 tablet (4 mg total) by mouth every 8 (eight) hours as needed for nausea or vomiting. 8 tablet 0   traZODone (DESYREL) 50 MG tablet Take 0.5-1 tablets (25-50 mg total) by mouth at bedtime as needed for sleep. 90 tablet 1   meclizine (ANTIVERT) 12.5 MG tablet Take 1 tablet (12.5 mg total) by mouth 3 (three) times daily as needed for dizziness. (Patient not taking: Reported on 01/27/2021) 30 tablet 0   nitroGLYCERIN (NITROSTAT) 0.4 MG SL tablet PLACE 1 TABLET (0.4 MG TOTAL) UNDER THE TONGUE EVERY 5 (FIVE) MINUTES AS NEEDED FOR CHEST PAIN. (Patient not taking: Reported on 10/18/2020) 25 tablet 0   pantoprazole (PROTONIX) 20 MG tablet Take 1 tablet (20 mg total) by mouth daily. (Patient not taking: No sig reported) 14 tablet 0   sucralfate (CARAFATE) 1 g tablet Take 1 tablet (1 g  total) by mouth 4 (four) times daily -  with meals and at bedtime. (Patient not taking: Reported on 01/27/2021) 40 tablet 0   No current facility-administered medications for this visit.    Allergies  Allergen Reactions   Other Anaphylaxis and Swelling    Peppermint Oil   Codeine     swelling   Imitrex [Sumatriptan] Swelling    tongue    Social History   Socioeconomic History   Marital status: Married    Spouse name: Not on file   Number of children: 1   Years of education: College   Highest education level:  Not on file  Occupational History    Employer: [employer]    Comment: [employer]  Tobacco Use   Smoking status: Every Day    Packs/day: 0.50    Years: 9.00    Pack years: 4.50    Types: Cigarettes   Smokeless tobacco: Never  Vaping Use   Vaping Use: Never used  Substance and Sexual Activity   Alcohol use: Yes    Comment: occasionally 1 drink weekly   Drug use: No   Sexual activity: Yes    Birth control/protection: Inserts  Other Topics Concern   Not on file  Social History Narrative   Patient lives at home with her daughter.   Caffeine Use: 2 sodas daily   Social Determinants of Health   Financial Resource Strain: Not on file  Food Insecurity: Not on file  Transportation Needs: Not on file  Physical Activity: Not on file  Stress: Not on file  Social Connections: Not on file  Intimate Partner Violence: Not on file    ROS Per hpi   Objective   Vitals as reported by the patient: There were no vitals filed for this visit.  Molly Gibbs was seen today for anxiety.  Diagnoses and all orders for this visit:  Anxiety and depression -     FLUoxetine (PROZAC) 40 MG capsule; Take 1 capsule (40 mg total) by mouth daily. -     traZODone (DESYREL) 50 MG tablet; Take 0.5-1 tablets (25-50 mg total) by mouth at bedtime as needed for sleep. -     ALPRAZolam (NIRAVAM) 0.25 MG dissolvable tablet; Take 1 tablet (0.25 mg total) by mouth 3 (three) times daily as needed for anxiety.  Sleep disturbance -     traZODone (DESYREL) 50 MG tablet; Take 0.5-1 tablets (25-50 mg total) by mouth at bedtime as needed for sleep.  Panic attack -     ALPRAZolam (NIRAVAM) 0.25 MG dissolvable tablet; Take 1 tablet (0.25 mg total) by mouth 3 (three) times daily as needed for anxiety.  PLAN Will increase fluoxetine to 40mg  po qd. Monitor for improved effect. Can consider alprazolam use seldomly for breakthrough anxiety. Discussed risks of this medication and pt voices understanding Start  trazodone 25-50mg  po qhs prn. Reviewed risks. Pt voices understanding Med check in 6-8 weeks, sooner with concerns Patient encouraged to call clinic with any questions, comments, or concerns.   I discussed the assessment and treatment plan with the patient. The patient was provided an opportunity to ask questions and all were answered. The patient agreed with the plan and demonstrated an understanding of the instructions.   The patient was advised to call back or seek an in-person evaluation if the symptoms worsen or if the condition fails to improve as anticipated.  I provided 17 minutes of non-face-to-face time during this encounter.  , NP

## 2021-01-27 NOTE — Progress Notes (Signed)
I connected with  Molly Gibbs on 01/27/21 by a video enabled telemedicine application and verified that I am speaking with the correct person using two identifiers.   I discussed the limitations of evaluation and management by telemedicine. The patient expressed understanding and agreed to proceed.

## 2021-04-07 DIAGNOSIS — M25512 Pain in left shoulder: Secondary | ICD-10-CM | POA: Diagnosis not present

## 2021-04-07 DIAGNOSIS — M5412 Radiculopathy, cervical region: Secondary | ICD-10-CM | POA: Diagnosis not present

## 2021-04-07 DIAGNOSIS — M542 Cervicalgia: Secondary | ICD-10-CM | POA: Diagnosis not present

## 2021-04-25 ENCOUNTER — Other Ambulatory Visit: Payer: Self-pay | Admitting: Physical Medicine & Rehabilitation

## 2021-04-25 DIAGNOSIS — M542 Cervicalgia: Secondary | ICD-10-CM

## 2021-04-25 DIAGNOSIS — M5412 Radiculopathy, cervical region: Secondary | ICD-10-CM | POA: Diagnosis not present

## 2021-04-28 ENCOUNTER — Ambulatory Visit
Admission: RE | Admit: 2021-04-28 | Discharge: 2021-04-28 | Disposition: A | Discharge status: Home / Self Care | Payer: BC Managed Care – PPO | Source: Ambulatory Visit | Attending: Physical Medicine & Rehabilitation | Admitting: Physical Medicine & Rehabilitation

## 2021-04-28 ENCOUNTER — Other Ambulatory Visit: Payer: Self-pay

## 2021-04-28 DIAGNOSIS — M542 Cervicalgia: Secondary | ICD-10-CM | POA: Diagnosis not present

## 2021-05-02 DIAGNOSIS — M5412 Radiculopathy, cervical region: Secondary | ICD-10-CM | POA: Diagnosis not present

## 2021-05-02 DIAGNOSIS — M542 Cervicalgia: Secondary | ICD-10-CM | POA: Diagnosis not present

## 2021-05-08 ENCOUNTER — Ambulatory Visit
Admission: RE | Admit: 2021-05-08 | Discharge: 2021-05-08 | Disposition: A | Discharge status: Home / Self Care | Payer: BC Managed Care – PPO | Source: Ambulatory Visit | Attending: Obstetrics | Admitting: Obstetrics

## 2021-05-08 ENCOUNTER — Other Ambulatory Visit: Payer: Self-pay | Admitting: Obstetrics

## 2021-05-08 DIAGNOSIS — N6310 Unspecified lump in the right breast, unspecified quadrant: Secondary | ICD-10-CM

## 2021-05-08 DIAGNOSIS — N6321 Unspecified lump in the left breast, upper outer quadrant: Secondary | ICD-10-CM | POA: Diagnosis not present

## 2021-05-15 DIAGNOSIS — M5412 Radiculopathy, cervical region: Secondary | ICD-10-CM | POA: Diagnosis not present

## 2021-05-30 DIAGNOSIS — M5412 Radiculopathy, cervical region: Secondary | ICD-10-CM | POA: Diagnosis not present

## 2021-05-30 DIAGNOSIS — M542 Cervicalgia: Secondary | ICD-10-CM | POA: Diagnosis not present

## 2021-06-08 DIAGNOSIS — M5412 Radiculopathy, cervical region: Secondary | ICD-10-CM | POA: Diagnosis not present

## 2021-06-13 DIAGNOSIS — M5090 Cervical disc disorder, unspecified, unspecified cervical region: Secondary | ICD-10-CM | POA: Diagnosis not present

## 2021-06-13 DIAGNOSIS — M5412 Radiculopathy, cervical region: Secondary | ICD-10-CM | POA: Diagnosis not present

## 2021-06-28 DIAGNOSIS — M5412 Radiculopathy, cervical region: Secondary | ICD-10-CM | POA: Diagnosis not present

## 2021-06-28 DIAGNOSIS — M542 Cervicalgia: Secondary | ICD-10-CM | POA: Diagnosis not present

## 2021-10-30 DIAGNOSIS — N63 Unspecified lump in unspecified breast: Secondary | ICD-10-CM | POA: Diagnosis not present

## 2021-11-01 ENCOUNTER — Other Ambulatory Visit: Payer: Self-pay | Admitting: Obstetrics

## 2021-11-01 DIAGNOSIS — N63 Unspecified lump in unspecified breast: Secondary | ICD-10-CM

## 2021-11-07 ENCOUNTER — Ambulatory Visit
Admission: RE | Admit: 2021-11-07 | Discharge: 2021-11-07 | Disposition: A | Discharge status: Home / Self Care | Payer: BC Managed Care – PPO | Source: Ambulatory Visit | Attending: Obstetrics | Admitting: Obstetrics

## 2021-11-07 DIAGNOSIS — N63 Unspecified lump in unspecified breast: Secondary | ICD-10-CM

## 2021-11-07 DIAGNOSIS — N6311 Unspecified lump in the right breast, upper outer quadrant: Secondary | ICD-10-CM | POA: Diagnosis not present

## 2021-12-01 ENCOUNTER — Other Ambulatory Visit: Payer: Self-pay | Admitting: Physical Medicine & Rehabilitation

## 2021-12-01 DIAGNOSIS — M5412 Radiculopathy, cervical region: Secondary | ICD-10-CM

## 2021-12-01 DIAGNOSIS — M542 Cervicalgia: Secondary | ICD-10-CM | POA: Diagnosis not present

## 2021-12-01 DIAGNOSIS — M9931 Osseous stenosis of neural canal of cervical region: Secondary | ICD-10-CM | POA: Diagnosis not present

## 2021-12-06 ENCOUNTER — Ambulatory Visit
Admission: RE | Admit: 2021-12-06 | Discharge: 2021-12-06 | Disposition: A | Discharge status: Home / Self Care | Payer: BC Managed Care – PPO | Source: Ambulatory Visit | Attending: Physical Medicine & Rehabilitation | Admitting: Physical Medicine & Rehabilitation

## 2021-12-06 DIAGNOSIS — M50122 Cervical disc disorder at C5-C6 level with radiculopathy: Secondary | ICD-10-CM | POA: Diagnosis not present

## 2021-12-06 DIAGNOSIS — M5412 Radiculopathy, cervical region: Secondary | ICD-10-CM

## 2021-12-06 MED ORDER — TRIAMCINOLONE ACETONIDE 40 MG/ML IJ SUSP (RADIOLOGY)
60.0000 mg | Freq: Once | INTRAMUSCULAR | Status: AC
  Administered 2021-12-06: 60 mg via EPIDURAL

## 2021-12-06 MED ORDER — IOPAMIDOL (ISOVUE-M 300) INJECTION 61%
1.0000 mL | Freq: Once | INTRAMUSCULAR | Status: AC
  Administered 2021-12-06: 1 mL via EPIDURAL

## 2021-12-06 NOTE — Discharge Instructions (Signed)

## 2022-01-09 ENCOUNTER — Other Ambulatory Visit: Payer: Self-pay | Admitting: Physical Medicine & Rehabilitation

## 2022-01-09 DIAGNOSIS — M9931 Osseous stenosis of neural canal of cervical region: Secondary | ICD-10-CM | POA: Diagnosis not present

## 2022-01-09 DIAGNOSIS — M542 Cervicalgia: Secondary | ICD-10-CM | POA: Diagnosis not present

## 2022-01-09 DIAGNOSIS — M5412 Radiculopathy, cervical region: Secondary | ICD-10-CM | POA: Diagnosis not present

## 2022-01-16 ENCOUNTER — Ambulatory Visit
Admission: RE | Admit: 2022-01-16 | Discharge: 2022-01-16 | Disposition: A | Discharge status: Home / Self Care | Payer: BC Managed Care – PPO | Source: Ambulatory Visit | Attending: Physical Medicine & Rehabilitation | Admitting: Physical Medicine & Rehabilitation

## 2022-01-16 DIAGNOSIS — M542 Cervicalgia: Secondary | ICD-10-CM | POA: Diagnosis not present

## 2022-01-24 NOTE — Progress Notes (Unsigned)
Referring Physician:  Elijah Birk, MD [address] [place],  [place] [address]  Primary Physician:  Janeece Agee, NP  History of Present Illness: 01/25/2022 [name] is here today with a chief complaint of an arm pain.  She is been having symptoms since October 2022.  She reports left shoulder blade pain that goes down her left arm to her thumb and index finger.  She has weakness in her left arm has trouble picking up items.  She reports spasms, sharp, numbness, and tingling.  Twisting, turning her neck, and laying down make it worse.  She has tried physical therapy which has worsened her pain each time she has participated in this.  She is also had injections which initially helped but no longer do.   Bowel/Bladder Dysfunction: none  Conservative measures:  Physical therapy: had evaluation on 06/13/2021 at [facility] and then was put on a home exercise program. Also went to 2 visits at an office in [place] in February 2023 - caused more pain Multimodal medical therapy including regular antiinflammatories:  gabapentin, hydrocodone, prednisone Injections:  has received epidural steroid injections 12/06/21:C7-T1 IL ESI (2 weeks and pain returned) 06/08/21: Left C5-6 TF ESI (90% relief) 05/15/21: Left C5-6 TF ESI (50% relief)  Past Surgery: denies  Molly Gibbs has no symptoms of cervical myelopathy.  The symptoms are causing a significant impact on the patient's life.   Review of Systems:  A 10 point review of systems is negative, except for the pertinent positives and negatives detailed in the HPI.  Past Medical History: Past Medical History:  Diagnosis Date   Anxiety    Asthma    Blood transfusion without reported diagnosis    Depression    GERD (gastroesophageal reflux disease)    Hyperlipidemia     Past Surgical History: Past Surgical History:  Procedure Laterality Date   CESAREAN SECTION     DILATION AND EVACUATION N/A 10/29/2012    Procedure: DILATATION AND EVACUATION WITH ULTRASOUND;  Surgeon: Alphonsus Sias. Ernestina Penna, MD;  Location: [facility];  Service: Gynecology;  Laterality: N/A;  CHROMOSOME STUDIES;POSSIBLE MOLAR PREGNANCY.   Bladder instillation performed    Allergies: Allergies as of 01/25/2022 - Review Complete 01/25/2022  Allergen Reaction Noted   Codeine Swelling 06/27/2012   Other Anaphylaxis and Swelling 03/18/2020   Imitrex [sumatriptan] Swelling 07/03/2013    Medications: No outpatient medications have been marked as taking for the 01/25/22 encounter (Office Visit) with Venetia Night, MD.    Social History: Social History   Tobacco Use   Smoking status: Every Day    Packs/day: 1.00    Years: 9.00    Total pack years: 9.00    Types: Cigarettes   Smokeless tobacco: Never  Vaping Use   Vaping Use: Never used  Substance Use Topics   Alcohol use: Yes    Comment: occasionally 1 drink weekly   Drug use: No    Family Medical History: Family History  Problem Relation Age of Onset   Arthritis Maternal Grandmother        Rheumatoid arthritis   Breast cancer Maternal Grandmother        breast   Heart disease Maternal Grandmother    Arthritis Maternal Grandfather    Arthritis Paternal Grandmother        Osteoarthritis   Heart disease Paternal Grandmother    Atrial fibrillation Paternal Grandmother    Arthritis Paternal Grandfather        Osteoarthritis   Lung  cancer Paternal Grandfather        lung    Physical Examination: Vitals:   01/25/22 0915  BP: 115/84  Pulse: 82    General: Patient is well developed, well nourished, calm, collected, and in no apparent distress. Attention to examination is appropriate.  Neck:   Supple.  Limited range of motion - pain with extension.  Respiratory: Patient is breathing without any difficulty.   NEUROLOGICAL:     Awake, alert, oriented to person, place, and time.  Speech is clear and fluent. Fund of knowledge is appropriate.   Cranial  Nerves: Pupils equal round and reactive to light.  Facial tone is symmetric.  Facial sensation is symmetric. Shoulder shrug is symmetric. Tongue protrusion is midline.  There is no pronator drift.  ROM of spine: full.    Strength: Side Biceps Triceps Deltoid Interossei Grip Wrist Ext. Wrist Flex.  R 5 5 5 5 5 5 5   L 4- 5 5 5 5 5 5    Side Iliopsoas Quads Hamstring PF DF EHL  R 5 5 5 5 5 5   L 5 5 5 5 5 5    Reflexes are 1+ and symmetric at the biceps, triceps, brachioradialis, patella and achilles.   Hoffman's is absent.  Clonus is not present.  Toes are down-going.  Bilateral upper and lower extremity sensation is intact to light touch except L C6 numbness and tingling.    No evidence of dysmetria noted.  Gait is normal.    Medical Decision Making  Imaging: MRI C spine 01/17/22 IMPRESSION: Persistent left paracentral disc herniation at C5-C6; correlate for C6 radicular symptoms. No new findings.     Electronically Signed   By: M.D.   On: 01/17/2022 15:37  I have personally reviewed the images and agree with the above interpretation.  Assessment and Plan: Molly Gibbs is a pleasant 31 y.o. female with left C6 radiculopathy due to a disc herniation at C5-6.  She has weakness in her left biceps.  She has tried physical therapy with worsening of her symptoms.  She is also had medications as well as injections.  She has had symptoms for almost 1 year.  At this point, she has failed conservative management.  I do not think that further conservative management is indicated given her length of symptoms, attempt at conservative management, and left biceps weakness.  I recommended surgical intervention.  I recommended a cervical disc arthroplasty.  We do that at C5-6.  She will get flexion-extension x-rays today to confirm her suitability.  I discussed the planned procedure at length with the patient, including the risks, benefits, alternatives, and indications. The risks  discussed include but are not limited to bleeding, infection, need for reoperation, spinal fluid leak, stroke, vision loss, anesthetic complication, coma, paralysis, and even death. We also discussed the possibility of post-operative dysphagia, vocal cord paralysis, and the risk of adjacent segment disease in the future. I also described in detail that improvement was not guaranteed.  The patient expressed understanding of these risks, and asked that we proceed with surgery. I described the surgery in layman's terms, and gave ample opportunity for questions, which were answered to the best of my ability.  I spent a total of 30 minutes in face-to-face and non-face-to-face activities related to this patient's care today.  Thank you for involving me in the care of this patient.      Debbra Digiulio K. Guadlupe Spanish MD, [facility] Neurosurgery

## 2022-01-24 NOTE — H&P (View-Only) (Signed)
Referring Physician:  Elijah Birk, MD [address] [place],  [place] [address]  Primary Physician:  Janeece Agee, NP  History of Present Illness: 01/25/2022 Molly Gibbs is here today with a chief complaint of an arm pain.  She is been having symptoms since October 2022.  She reports left shoulder blade pain that goes down her left arm to her thumb and index finger.  She has weakness in her left arm has trouble picking up items.  She reports spasms, sharp, numbness, and tingling.  Twisting, turning her neck, and laying down make it worse.  She has tried physical therapy which has worsened her pain each time she has participated in this.  She is also had injections which initially helped but no longer do.   Bowel/Bladder Dysfunction: none  Conservative measures:  Physical therapy: had evaluation on 06/13/2021 at [facility] and then was put on a home exercise program. Also went to 2 visits at an office in [place] in February 2023 - caused more pain Multimodal medical therapy including regular antiinflammatories:  gabapentin, hydrocodone, prednisone Injections:  has received epidural steroid injections 12/06/21:C7-T1 IL ESI (2 weeks and pain returned) 06/08/21: Left C5-6 TF ESI (90% relief) 05/15/21: Left C5-6 TF ESI (50% relief)  Past Surgery: denies  Molly Gibbs has no symptoms of cervical myelopathy.  The symptoms are causing a significant impact on the patient's life.   Review of Systems:  A 10 point review of systems is negative, except for the pertinent positives and negatives detailed in the HPI.  Past Medical History: Past Medical History:  Diagnosis Date   Anxiety    Asthma    Blood transfusion without reported diagnosis    Depression    GERD (gastroesophageal reflux disease)    Hyperlipidemia     Past Surgical History: Past Surgical History:  Procedure Laterality Date   CESAREAN SECTION     DILATION AND EVACUATION N/A 10/29/2012    Procedure: DILATATION AND EVACUATION WITH ULTRASOUND;  Surgeon: Alphonsus Sias. Ernestina Penna, MD;  Location: [facility];  Service: Gynecology;  Laterality: N/A;  CHROMOSOME STUDIES;POSSIBLE MOLAR PREGNANCY.   Bladder instillation performed    Allergies: Allergies as of 01/25/2022 - Review Complete 01/25/2022  Allergen Reaction Noted   Codeine Swelling 06/27/2012   Other Anaphylaxis and Swelling 03/18/2020   Imitrex [sumatriptan] Swelling 07/03/2013    Medications: No outpatient medications have been marked as taking for the 01/25/22 encounter (Office Visit) with Venetia Night, MD.    Social History: Social History   Tobacco Use   Smoking status: Every Day    Packs/day: 1.00    Years: 9.00    Total pack years: 9.00    Types: Cigarettes   Smokeless tobacco: Never  Vaping Use   Vaping Use: Never used  Substance Use Topics   Alcohol use: Yes    Comment: occasionally 1 drink weekly   Drug use: No    Family Medical History: Family History  Problem Relation Age of Onset   Arthritis Maternal Grandmother        Rheumatoid arthritis   Breast cancer Maternal Grandmother        breast   Heart disease Maternal Grandmother    Arthritis Maternal Grandfather    Arthritis Paternal Grandmother        Osteoarthritis   Heart disease Paternal Grandmother    Atrial fibrillation Paternal Grandmother    Arthritis Paternal Grandfather        Osteoarthritis   Lung  cancer Paternal Grandfather        lung    Physical Examination: Vitals:   01/25/22 0915  BP: 115/84  Pulse: 82    General: Patient is well developed, well nourished, calm, collected, and in no apparent distress. Attention to examination is appropriate.  Neck:   Supple.  Limited range of motion - pain with extension.  Respiratory: Patient is breathing without any difficulty.   NEUROLOGICAL:     Awake, alert, oriented to person, place, and time.  Speech is clear and fluent. Fund of knowledge is appropriate.   Cranial  Nerves: Pupils equal round and reactive to light.  Facial tone is symmetric.  Facial sensation is symmetric. Shoulder shrug is symmetric. Tongue protrusion is midline.  There is no pronator drift.  ROM of spine: full.    Strength: Side Biceps Triceps Deltoid Interossei Grip Wrist Ext. Wrist Flex.  R 5 5 5 5 5 5 5   L 4- 5 5 5 5 5 5    Side Iliopsoas Quads Hamstring PF DF EHL  R 5 5 5 5 5 5   L 5 5 5 5 5 5    Reflexes are 1+ and symmetric at the biceps, triceps, brachioradialis, patella and achilles.   Hoffman's is absent.  Clonus is not present.  Toes are down-going.  Bilateral upper and lower extremity sensation is intact to light touch except L C6 numbness and tingling.    No evidence of dysmetria noted.  Gait is normal.    Medical Decision Making  Imaging: MRI C spine 01/17/22 IMPRESSION: Persistent left paracentral disc herniation at C5-C6; correlate for C6 radicular symptoms. No new findings.     Electronically Signed   By: M.D.   On: 01/17/2022 15:37  I have personally reviewed the images and agree with the above interpretation.  Assessment and Plan: Ms. Usery is a pleasant 31 y.o. female with left C6 radiculopathy due to a disc herniation at C5-6.  She has weakness in her left biceps.  She has tried physical therapy with worsening of her symptoms.  She is also had medications as well as injections.  She has had symptoms for almost 1 year.  At this point, she has failed conservative management.  I do not think that further conservative management is indicated given her length of symptoms, attempt at conservative management, and left biceps weakness.  I recommended surgical intervention.  I recommended a cervical disc arthroplasty.  We do that at C5-6.  She will get flexion-extension x-rays today to confirm her suitability.  I discussed the planned procedure at length with the patient, including the risks, benefits, alternatives, and indications. The risks  discussed include but are not limited to bleeding, infection, need for reoperation, spinal fluid leak, stroke, vision loss, anesthetic complication, coma, paralysis, and even death. We also discussed the possibility of post-operative dysphagia, vocal cord paralysis, and the risk of adjacent segment disease in the future. I also described in detail that improvement was not guaranteed.  The patient expressed understanding of these risks, and asked that we proceed with surgery. I described the surgery in layman's terms, and gave ample opportunity for questions, which were answered to the best of my ability.  I spent a total of 30 minutes in face-to-face and non-face-to-face activities related to this patient's care today.  Thank you for involving me in the care of this patient.      Theo Krumholz K. Guadlupe Spanish MD, [facility] Neurosurgery

## 2022-01-25 ENCOUNTER — Ambulatory Visit
Admission: RE | Admit: 2022-01-25 | Discharge: 2022-01-25 | Disposition: A | Discharge status: Home / Self Care | Payer: BC Managed Care – PPO | Source: Ambulatory Visit | Attending: Neurosurgery | Admitting: Neurosurgery

## 2022-01-25 ENCOUNTER — Ambulatory Visit: Payer: BC Managed Care – PPO | Admitting: Neurosurgery

## 2022-01-25 ENCOUNTER — Encounter: Payer: Self-pay | Admitting: Neurosurgery

## 2022-01-25 ENCOUNTER — Ambulatory Visit
Admission: RE | Admit: 2022-01-25 | Discharge: 2022-01-25 | Disposition: A | Discharge status: Home / Self Care | Payer: BC Managed Care – PPO | Attending: Neurosurgery | Admitting: Neurosurgery

## 2022-01-25 VITALS — BP 115/84 | HR 82 | Ht 63.0 in | Wt 173.0 lb

## 2022-01-25 DIAGNOSIS — M5412 Radiculopathy, cervical region: Secondary | ICD-10-CM | POA: Diagnosis not present

## 2022-01-25 DIAGNOSIS — R531 Weakness: Secondary | ICD-10-CM | POA: Diagnosis not present

## 2022-01-25 DIAGNOSIS — R29898 Other symptoms and signs involving the musculoskeletal system: Secondary | ICD-10-CM | POA: Diagnosis not present

## 2022-01-25 DIAGNOSIS — M542 Cervicalgia: Secondary | ICD-10-CM | POA: Diagnosis not present

## 2022-01-25 NOTE — Patient Instructions (Signed)
Please see below for information in regards to your upcoming surgery:  Planned surgery: C5-6 arthroplasty   Surgery date: 02/21/22 - you will find out your arrival time the business day before your surgery.   Pre-op appointment at [facility] Pre-admit Testing: we will call you with a date/time for this. Pre-admit testing is located on the first floor of the [facility] building, [address] [facility] [address], [address].   Pre-op labs may be done at your pre-op appointment. You are not required to fast for these labs.    Should you need to change your pre-op appointment, please call Pre-admit testing at [phone].      Because you are having an arthroplasty: for appointments after your 2 week follow-up: please arrive at the [facility] outpatient imaging center ([address], [address], [employer]) or [facility] one hour prior to your appointment for x-rays. This applies to every appointment after your 2 week follow-up. Failure to do so may result in your appointment being rescheduled.   If you have FMLA/disability paperwork, please drop it off or fax it to [phone], attention Patty.   If you have any questions/concerns before or after surgery, you can reach [place] at [phone], or you can send a mychart message. If you have a concern after hours that cannot wait until normal business hours, you can call [phone] or [phone] and ask the answering service to page the neurosurgeon on call.    Appointments/FMLA & disability paperwork: [place] Nurse: Royston Cowper  Medical assistant: Irving Burton Physician Assistant's: Manning Charity & Drake Leach Surgeon: Venetia Night, MD

## 2022-01-26 ENCOUNTER — Other Ambulatory Visit: Payer: Self-pay

## 2022-01-26 DIAGNOSIS — Z01818 Encounter for other preprocedural examination: Secondary | ICD-10-CM

## 2022-02-09 NOTE — Patient Instructions (Addendum)
Your procedure is scheduled on:02-21-22 Wednesday Report to the Registration Desk on the 1st floor of the [facility].Then proceed to the 2nd floor Surgery Desk  To find out your arrival time, please call [phone] between 1PM - 3PM on:02-20-22 Tuesday If your arrival time is 6:00 am, do not arrive prior to that time as the [facility] entrance doors do not open until 6:00 am.  REMEMBER: Instructions that are not followed completely may result in serious medical risk, up to and including death; or upon the discretion of your surgeon and anesthesiologist your surgery may need to be rescheduled.  Do not eat food after midnight the night before surgery.  No gum chewing, lozengers or hard candies.  You may however, drink CLEAR liquids up to 2 hours before you are scheduled to arrive for your surgery. Do not drink anything within 2 hours of your scheduled arrival time.  Clear liquids include: - water  - apple juice without pulp - gatorade (not RED colors) - black coffee or tea (Do NOT add milk or creamers to the coffee or tea) Do NOT drink anything that is not on this list.  Take these medications the day of surgery with a small sip of water: -gabapentin (NEURONTIN)  One week prior to surgery: Stop Anti-inflammatories (NSAIDS) such as Advil, Aleve, Ibuprofen, Motrin, Naproxen, Naprosyn and Aspirin based products such as Excedrin, Goodys Powder, BC Powder. You may however, take Tylenol if needed for pain up until the day of surgery.  Stop ANY OVER THE COUNTER supplements/vitamins 7 days prior to surgery   No Alcohol for 24 hours before or after surgery.  No Smoking including e-cigarettes for 24 hours prior to surgery.  No chewable tobacco products for at least 6 hours prior to surgery.  No nicotine patches on the day of surgery.  Do not use any "recreational" drugs for at least a week prior to your surgery.  Please be advised that the combination of cocaine and anesthesia may have  negative outcomes, up to and including death. If you test positive for cocaine, your surgery will be cancelled.  On the morning of surgery brush your teeth with toothpaste and water, you may rinse your mouth with mouthwash if you wish. Do not swallow any toothpaste or mouthwash.  Use CHG Soap as directed on instruction sheet.  Do not wear jewelry, make-up, hairpins, clips or nail polish.  Do not wear lotions, powders, or perfumes.   Do not shave body from the neck down 48 hours prior to surgery just in case you cut yourself which could leave a site for infection.  Also, freshly shaved skin may become irritated if using the CHG soap.  Contact lenses, hearing aids and dentures may not be worn into surgery.  Do not bring valuables to the hospital. [facility] is not responsible for any missing/lost belongings or valuables.  Notify your doctor if there is any change in your medical condition (cold, fever, infection).  Wear comfortable clothing (specific to your surgery type) to the hospital.  After surgery, you can help prevent lung complications by doing breathing exercises.  Take deep breaths and cough every 1-2 hours. Your doctor may order a device called an Incentive Spirometer to help you take deep breaths. When coughing or sneezing, hold a pillow firmly against your incision with both hands. This is called "splinting." Doing this helps protect your incision. It also decreases belly discomfort.  If you are being admitted to the hospital overnight, leave your suitcase  in the car. After surgery it may be brought to your room.  If you are being discharged the day of surgery, you will not be allowed to drive home. You will need a responsible adult (18 years or older) to drive you home and stay with you that night.   If you are taking public transportation, you will need to have a responsible adult (18 years or older) with you. Please confirm with your physician that it is acceptable to  use public transportation.   Please call the Pre-admissions Testing Dept. at [phone] if you have any questions about these instructions.  Surgery Visitation Policy:  Patients undergoing a surgery or procedure may have two family members or support persons with them as long as the person is not COVID-19 positive or experiencing its symptoms.

## 2022-02-12 ENCOUNTER — Encounter
Admission: RE | Admit: 2022-02-12 | Discharge: 2022-02-12 | Disposition: A | Discharge status: Home / Self Care | Payer: BC Managed Care – PPO | Source: Ambulatory Visit | Attending: Neurosurgery | Admitting: Neurosurgery

## 2022-02-12 VITALS — BP 105/77 | HR 74 | Resp 14 | Ht 63.0 in | Wt 177.9 lb

## 2022-02-12 DIAGNOSIS — Z01818 Encounter for other preprocedural examination: Secondary | ICD-10-CM | POA: Diagnosis not present

## 2022-02-12 DIAGNOSIS — E782 Mixed hyperlipidemia: Secondary | ICD-10-CM | POA: Diagnosis not present

## 2022-02-12 DIAGNOSIS — E669 Obesity, unspecified: Secondary | ICD-10-CM | POA: Diagnosis not present

## 2022-02-12 DIAGNOSIS — Z6830 Body mass index (BMI) 30.0-30.9, adult: Secondary | ICD-10-CM | POA: Diagnosis not present

## 2022-02-12 DIAGNOSIS — Z01810 Encounter for preprocedural cardiovascular examination: Secondary | ICD-10-CM

## 2022-02-12 DIAGNOSIS — Z01812 Encounter for preprocedural laboratory examination: Secondary | ICD-10-CM

## 2022-02-12 HISTORY — DX: Tobacco use: Z72.0

## 2022-02-12 HISTORY — DX: Other specified postprocedural states: Z98.890

## 2022-02-12 HISTORY — DX: Obesity, unspecified: E66.9

## 2022-02-12 HISTORY — DX: Radiculopathy, cervical region: M54.12

## 2022-02-12 HISTORY — DX: Chest pain, unspecified: R07.9

## 2022-02-12 HISTORY — DX: Other complications of anesthesia, initial encounter: T88.59XA

## 2022-02-12 HISTORY — DX: Anemia, unspecified: D64.9

## 2022-02-12 LAB — BASIC METABOLIC PANEL
Anion gap: 8 (ref 5–15)
BUN: 11 mg/dL (ref 6–20)
CO2: 23 mmol/L (ref 22–32)
Calcium: 9 mg/dL (ref 8.9–10.3)
Chloride: 109 mmol/L (ref 98–111)
Creatinine, Ser: 0.53 mg/dL (ref 0.44–1.00)
GFR, Estimated: >60 mL/min (ref >60)
Glucose, Bld: 84 mg/dL (ref 70–99)
Potassium: 4.2 mmol/L (ref 3.5–5.1)
Sodium: 140 mmol/L (ref 135–145)

## 2022-02-12 LAB — CBC
HCT: 39.5 % (ref 36.0–46.0)
Hemoglobin: 13.6 g/dL (ref 12.0–15.0)
MCH: 31.1 pg (ref 26.0–34.0)
MCHC: 34.4 g/dL (ref 30.0–36.0)
MCV: 90.2 fL (ref 80.0–100.0)
Platelets: 195 10*3/uL (ref 150–400)
RBC: 4.38 MIL/uL (ref 3.87–5.11)
RDW: 11.9 % (ref 11.5–15.5)
WBC: 5.9 10*3/uL (ref 4.0–10.5)
nRBC: 0 % (ref 0.0–0.2)

## 2022-02-12 LAB — SURGICAL PCR SCREEN
MRSA, PCR: NEGATIVE
Staphylococcus aureus: NEGATIVE

## 2022-02-12 LAB — TYPE AND SCREEN
ABO/RH(D): AB POS
Antibody Screen: NEGATIVE

## 2022-02-20 MED ORDER — ORAL CARE MOUTH RINSE: 15.0000 mL | Freq: Once | OROMUCOSAL | Status: AC

## 2022-02-20 MED ORDER — CHLORHEXIDINE GLUCONATE 0.12 % MT SOLN: 15.0000 mL | Freq: Once | OROMUCOSAL | Status: AC

## 2022-02-20 MED ORDER — CEFAZOLIN SODIUM-DEXTROSE 2-4 GM/100ML-% IV SOLN
2.0000 g | Freq: Once | INTRAVENOUS | Status: AC
  Administered 2022-02-21: 2 g via INTRAVENOUS
  Filled 2022-02-20: qty 100

## 2022-02-20 MED ORDER — LACTATED RINGERS IV SOLN: INTRAVENOUS | Status: DC

## 2022-02-20 MED ORDER — FAMOTIDINE 20 MG PO TABS: 20.0000 mg | ORAL_TABLET | Freq: Once | ORAL | Status: AC

## 2022-02-21 ENCOUNTER — Ambulatory Visit: Payer: BC Managed Care – PPO | Admitting: Urgent Care

## 2022-02-21 ENCOUNTER — Other Ambulatory Visit: Payer: Self-pay

## 2022-02-21 ENCOUNTER — Encounter: Payer: Self-pay | Admitting: Neurosurgery

## 2022-02-21 ENCOUNTER — Ambulatory Visit
Admission: RE | Admit: 2022-02-21 | Discharge: 2022-02-21 | Disposition: A | Discharge status: Home / Self Care | Payer: BC Managed Care – PPO | Attending: Neurosurgery | Admitting: Neurosurgery

## 2022-02-21 ENCOUNTER — Encounter
Admission: RE | Disposition: A | Discharge status: Home / Self Care | Payer: Self-pay | Source: Home / Self Care | Attending: Neurosurgery

## 2022-02-21 ENCOUNTER — Ambulatory Visit: Payer: BC Managed Care – PPO | Admitting: Certified Registered"

## 2022-02-21 ENCOUNTER — Ambulatory Visit: Payer: BC Managed Care – PPO

## 2022-02-21 DIAGNOSIS — M5412 Radiculopathy, cervical region: Secondary | ICD-10-CM | POA: Diagnosis not present

## 2022-02-21 DIAGNOSIS — Z87891 Personal history of nicotine dependence: Secondary | ICD-10-CM | POA: Insufficient documentation

## 2022-02-21 DIAGNOSIS — M50122 Cervical disc disorder at C5-C6 level with radiculopathy: Secondary | ICD-10-CM | POA: Diagnosis present

## 2022-02-21 DIAGNOSIS — Z01812 Encounter for preprocedural laboratory examination: Secondary | ICD-10-CM

## 2022-02-21 DIAGNOSIS — F418 Other specified anxiety disorders: Secondary | ICD-10-CM | POA: Diagnosis not present

## 2022-02-21 DIAGNOSIS — R29898 Other symptoms and signs involving the musculoskeletal system: Secondary | ICD-10-CM

## 2022-02-21 DIAGNOSIS — Z01818 Encounter for other preprocedural examination: Secondary | ICD-10-CM

## 2022-02-21 DIAGNOSIS — J45909 Unspecified asthma, uncomplicated: Secondary | ICD-10-CM | POA: Diagnosis not present

## 2022-02-21 HISTORY — PX: CERVICAL DISC ARTHROPLASTY: SHX587

## 2022-02-21 LAB — POCT PREGNANCY, URINE: Preg Test, Ur: NEGATIVE

## 2022-02-21 SURGERY — CERVICAL ANTERIOR DISC ARTHROPLASTY
Anesthesia: General | Site: Neck

## 2022-02-21 MED ORDER — HYDROMORPHONE HCL 1 MG/ML IJ SOLN
INTRAMUSCULAR | Status: AC
  Filled 2022-02-21: qty 1

## 2022-02-21 MED ORDER — BUPIVACAINE-EPINEPHRINE (PF) 0.5% -1:200000 IJ SOLN
INTRAMUSCULAR | Status: AC
  Filled 2022-02-21: qty 30

## 2022-02-21 MED ORDER — PROPOFOL 10 MG/ML IV BOLUS
INTRAVENOUS | Status: DC | PRN
  Administered 2022-02-21: 30 mg via INTRAVENOUS
  Administered 2022-02-21: 170 mg via INTRAVENOUS

## 2022-02-21 MED ORDER — CHLORHEXIDINE GLUCONATE 0.12 % MT SOLN
OROMUCOSAL | Status: AC
  Administered 2022-02-21: 15 mL via OROMUCOSAL
  Filled 2022-02-21: qty 15

## 2022-02-21 MED ORDER — OXYCODONE HCL 5 MG PO TABS
5.0000 mg | ORAL_TABLET | Freq: Once | ORAL | Status: AC | PRN
  Administered 2022-02-21: 5 mg via ORAL

## 2022-02-21 MED ORDER — PHENYLEPHRINE HCL (PRESSORS) 10 MG/ML IV SOLN
INTRAVENOUS | Status: AC
  Filled 2022-02-21: qty 1

## 2022-02-21 MED ORDER — ACETAMINOPHEN 10 MG/ML IV SOLN: 1000.0000 mg | Freq: Once | INTRAVENOUS | Status: DC | PRN

## 2022-02-21 MED ORDER — REMIFENTANIL HCL 1 MG IV SOLR
INTRAVENOUS | Status: DC | PRN
  Administered 2022-02-21: .1 ug/kg/min via INTRAVENOUS

## 2022-02-21 MED ORDER — EPHEDRINE SULFATE (PRESSORS) 50 MG/ML IJ SOLN
INTRAMUSCULAR | Status: DC | PRN
  Administered 2022-02-21: 10 mg via INTRAVENOUS
  Administered 2022-02-21: 5 mg via INTRAVENOUS

## 2022-02-21 MED ORDER — MIDAZOLAM HCL 2 MG/2ML IJ SOLN
INTRAMUSCULAR | Status: AC
  Filled 2022-02-21: qty 2

## 2022-02-21 MED ORDER — REMIFENTANIL HCL 1 MG IV SOLR
INTRAVENOUS | Status: AC
  Filled 2022-02-21: qty 1000

## 2022-02-21 MED ORDER — LACTATED RINGERS IV SOLN: INTRAVENOUS | Status: DC

## 2022-02-21 MED ORDER — SCOPOLAMINE 1 MG/3DAYS TD PT72
1.0000 | MEDICATED_PATCH | TRANSDERMAL | Status: DC
  Administered 2022-02-21: 1.5 mg via TRANSDERMAL

## 2022-02-21 MED ORDER — HYDROMORPHONE HCL 1 MG/ML IJ SOLN
INTRAMUSCULAR | Status: DC | PRN
  Administered 2022-02-21 (×2): .5 mg via INTRAVENOUS

## 2022-02-21 MED ORDER — ACETAMINOPHEN 10 MG/ML IV SOLN
INTRAVENOUS | Status: DC | PRN
  Administered 2022-02-21: 1000 mg via INTRAVENOUS

## 2022-02-21 MED ORDER — FENTANYL CITRATE (PF) 100 MCG/2ML IJ SOLN
INTRAMUSCULAR | Status: AC
  Filled 2022-02-21: qty 2

## 2022-02-21 MED ORDER — ONDANSETRON HCL 4 MG/2ML IJ SOLN
INTRAMUSCULAR | Status: DC | PRN
  Administered 2022-02-21 (×2): 4 mg via INTRAVENOUS

## 2022-02-21 MED ORDER — FAMOTIDINE 20 MG PO TABS
ORAL_TABLET | ORAL | Status: AC
  Administered 2022-02-21: 20 mg via ORAL
  Filled 2022-02-21: qty 1

## 2022-02-21 MED ORDER — PROPOFOL 1000 MG/100ML IV EMUL
INTRAVENOUS | Status: AC
  Filled 2022-02-21: qty 100

## 2022-02-21 MED ORDER — CEFAZOLIN SODIUM-DEXTROSE 2-4 GM/100ML-% IV SOLN
INTRAVENOUS | Status: AC
  Filled 2022-02-21: qty 100

## 2022-02-21 MED ORDER — BUPIVACAINE-EPINEPHRINE (PF) 0.5% -1:200000 IJ SOLN
INTRAMUSCULAR | Status: DC | PRN
  Administered 2022-02-21: 3 mL

## 2022-02-21 MED ORDER — ACETAMINOPHEN 10 MG/ML IV SOLN
INTRAVENOUS | Status: AC
  Filled 2022-02-21: qty 100

## 2022-02-21 MED ORDER — METHOCARBAMOL 750 MG PO TABS: 750.0000 mg | ORAL_TABLET | Freq: Three times a day (TID) | ORAL | 0 refills | Status: DC | PRN

## 2022-02-21 MED ORDER — IBUPROFEN 800 MG PO TABS: 800.0000 mg | ORAL_TABLET | Freq: Three times a day (TID) | ORAL | 0 refills | Status: AC

## 2022-02-21 MED ORDER — OXYCODONE HCL 5 MG PO TABS
ORAL_TABLET | ORAL | Status: AC
  Filled 2022-02-21: qty 1

## 2022-02-21 MED ORDER — GLYCOPYRROLATE 0.2 MG/ML IJ SOLN
INTRAMUSCULAR | Status: DC | PRN
  Administered 2022-02-21: .2 mg via INTRAVENOUS

## 2022-02-21 MED ORDER — 0.9 % SODIUM CHLORIDE (POUR BTL) OPTIME
TOPICAL | Status: DC | PRN
  Administered 2022-02-21: 500 mL

## 2022-02-21 MED ORDER — PHENYLEPHRINE HCL-NACL 20-0.9 MG/250ML-% IV SOLN
INTRAVENOUS | Status: DC | PRN
  Administered 2022-02-21: 20 ug/min via INTRAVENOUS

## 2022-02-21 MED ORDER — SCOPOLAMINE 1 MG/3DAYS TD PT72
MEDICATED_PATCH | TRANSDERMAL | Status: AC
  Filled 2022-02-21: qty 1

## 2022-02-21 MED ORDER — SUCCINYLCHOLINE CHLORIDE 200 MG/10ML IV SOSY
PREFILLED_SYRINGE | INTRAVENOUS | Status: DC | PRN
  Administered 2022-02-21: 80 mg via INTRAVENOUS

## 2022-02-21 MED ORDER — SURGIFLO WITH THROMBIN (HEMOSTATIC MATRIX KIT) OPTIME
TOPICAL | Status: DC | PRN
  Administered 2022-02-21: 1 via TOPICAL

## 2022-02-21 MED ORDER — APREPITANT 40 MG PO CAPS
40.0000 mg | ORAL_CAPSULE | Freq: Once | ORAL | Status: AC
  Administered 2022-02-21: 40 mg via ORAL

## 2022-02-21 MED ORDER — ONDANSETRON HCL 4 MG/2ML IJ SOLN: 4.0000 mg | Freq: Once | INTRAMUSCULAR | Status: DC | PRN

## 2022-02-21 MED ORDER — OXYCODONE HCL 5 MG PO TABS: 5.0000 mg | ORAL_TABLET | ORAL | 0 refills | Status: DC | PRN

## 2022-02-21 MED ORDER — DEXAMETHASONE SODIUM PHOSPHATE 10 MG/ML IJ SOLN
INTRAMUSCULAR | Status: DC | PRN
  Administered 2022-02-21: 10 mg via INTRAVENOUS

## 2022-02-21 MED ORDER — MIDAZOLAM HCL 2 MG/2ML IJ SOLN
INTRAMUSCULAR | Status: DC | PRN
  Administered 2022-02-21: 2 mg via INTRAVENOUS

## 2022-02-21 MED ORDER — FENTANYL CITRATE (PF) 100 MCG/2ML IJ SOLN
INTRAMUSCULAR | Status: DC | PRN
  Administered 2022-02-21 (×2): 50 ug via INTRAVENOUS

## 2022-02-21 MED ORDER — LIDOCAINE HCL (CARDIAC) PF 100 MG/5ML IV SOSY
PREFILLED_SYRINGE | INTRAVENOUS | Status: DC | PRN
  Administered 2022-02-21: 80 mg via INTRAVENOUS

## 2022-02-21 MED ORDER — FENTANYL CITRATE (PF) 100 MCG/2ML IJ SOLN
25.0000 ug | INTRAMUSCULAR | Status: DC | PRN
  Administered 2022-02-21: 50 ug via INTRAVENOUS

## 2022-02-21 MED ORDER — OXYCODONE HCL 5 MG/5ML PO SOLN: 5.0000 mg | Freq: Once | ORAL | Status: AC | PRN

## 2022-02-21 SURGICAL SUPPLY — 55 items
ADH SKN CLS APL DERMABOND .7 (GAUZE/BANDAGES/DRESSINGS) ×1
AGENT HMST KT MTR STRL THRMB (HEMOSTASIS) ×1
APL PRP STRL LF DISP 70% ISPRP (MISCELLANEOUS)
BUR NEURO DRILL SOFT 3.0X3.8M (BURR) ×1 IMPLANT
CHLORAPREP W/TINT 26 (MISCELLANEOUS) ×2 IMPLANT
COUNTER NEEDLE 20/40 LG (NEEDLE) ×1 IMPLANT
DERMABOND ADVANCED .7 DNX12 (GAUZE/BANDAGES/DRESSINGS) ×1 IMPLANT
DISC MOBI-C CERVICAL 13X15 H6 (Miscellaneous) IMPLANT
DRAPE C ARM PK CFD 31 SPINE (DRAPES) ×1 IMPLANT
DRAPE LAPAROTOMY 77X122 PED (DRAPES) ×1 IMPLANT
DRAPE MICROSCOPE SPINE 48X150 (DRAPES) ×1 IMPLANT
DRAPE SURG 17X11 SM STRL (DRAPES) ×1 IMPLANT
ELECT CAUTERY BLADE TIP 2.5 (TIP) ×1
ELECT REM PT RETURN 9FT ADLT (ELECTROSURGICAL) ×1
ELECTRODE CAUTERY BLDE TIP 2.5 (TIP) ×1 IMPLANT
ELECTRODE REM PT RTRN 9FT ADLT (ELECTROSURGICAL) ×1 IMPLANT
FEE INTRAOP CADWELL SUPPLY NCS (MISCELLANEOUS) IMPLANT
FEE INTRAOP MONITOR IMPULS NCS (MISCELLANEOUS) IMPLANT
GLOVE BIOGEL PI IND STRL 6.5 (GLOVE) ×1 IMPLANT
GLOVE BIOGEL PI IND STRL 8.5 (GLOVE) ×1 IMPLANT
GLOVE SURG SYN 6.5 ES PF (GLOVE) ×1 IMPLANT
GLOVE SURG SYN 6.5 PF PI (GLOVE) ×1 IMPLANT
GLOVE SURG SYN 8.5  E (GLOVE) ×3
GLOVE SURG SYN 8.5 E (GLOVE) ×3 IMPLANT
GLOVE SURG SYN 8.5 PF PI (GLOVE) ×3 IMPLANT
GOWN SRG LRG LVL 4 IMPRV REINF (GOWNS) ×1 IMPLANT
GOWN SRG XL LVL 3 NONREINFORCE (GOWNS) ×1 IMPLANT
GOWN STRL NON-REIN TWL XL LVL3 (GOWNS) ×1
GOWN STRL REIN LRG LVL4 (GOWNS) ×1
GRADUATE 1200CC STRL 31836 (MISCELLANEOUS) ×1 IMPLANT
INTRAOP CADWELL SUPPLY FEE NCS (MISCELLANEOUS)
INTRAOP DISP SUPPLY FEE NCS (MISCELLANEOUS)
INTRAOP MONITOR FEE IMPULS NCS (MISCELLANEOUS)
INTRAOP MONITOR FEE IMPULSE (MISCELLANEOUS)
KIT TURNOVER KIT A (KITS) ×1 IMPLANT
MANIFOLD NEPTUNE II (INSTRUMENTS) ×1 IMPLANT
MARKER SKIN DUAL TIP RULER LAB (MISCELLANEOUS) ×2 IMPLANT
NDL SAFETY ECLIP 18X1.5 (MISCELLANEOUS) IMPLANT
NS IRRIG 1000ML POUR BTL (IV SOLUTION) ×1 IMPLANT
NS IRRIG 500ML POUR BTL (IV SOLUTION) IMPLANT
PACK LAMINECTOMY NEURO (CUSTOM PROCEDURE TRAY) ×1 IMPLANT
PAD ARMBOARD 7.5X6 YLW CONV (MISCELLANEOUS) ×1 IMPLANT
PIN CASPAR SPINAL 12MM (PIN) IMPLANT
SOLUTION IRRIG SURGIPHOR (IV SOLUTION) ×1 IMPLANT
SPONGE KITTNER 5P (MISCELLANEOUS) ×1 IMPLANT
STAPLER SKIN PROX 35W (STAPLE) IMPLANT
SURGIFLO W/THROMBIN 8M KIT (HEMOSTASIS) ×1 IMPLANT
SUT DVC VLOC 3-0 CL 6 P-12 (SUTURE) IMPLANT
SUT VIC AB 3-0 SH 8-18 (SUTURE) ×1 IMPLANT
SYR 30ML LL (SYRINGE) ×1 IMPLANT
TAPE CLOTH 3X10 WHT NS LF (GAUZE/BANDAGES/DRESSINGS) ×1 IMPLANT
TOWEL OR 17X26 4PK STRL BLUE (TOWEL DISPOSABLE) ×3 IMPLANT
TRAP FLUID SMOKE EVACUATOR (MISCELLANEOUS) ×1 IMPLANT
TRAY FOLEY MTR SLVR 16FR STAT (SET/KITS/TRAYS/PACK) IMPLANT
TUBING CONNECTING 10 (TUBING) ×1 IMPLANT

## 2022-02-21 NOTE — Anesthesia Preprocedure Evaluation (Addendum)
Anesthesia Evaluation  Patient identified by MRN, date of birth, ID band Patient awake    Reviewed: Allergy & Precautions, NPO status , Patient's Chart, lab work & pertinent test results  History of Anesthesia Complications (+) PONV and history of anesthetic complications  Airway Mallampati: I   Neck ROM: Full    Dental no notable dental hx.    Pulmonary asthma , former smoker (quit 2 weeks ago),    Pulmonary exam normal breath sounds clear to auscultation       Cardiovascular Exercise Tolerance: Good Normal cardiovascular exam Rhythm:Regular Rate:Normal  ECG 02/12/22: normal   Neuro/Psych PSYCHIATRIC DISORDERS Anxiety Depression negative neurological ROS     GI/Hepatic GERD  ,  Endo/Other  Obesity   Renal/GU negative Renal ROS     Musculoskeletal   Abdominal   Peds  Hematology  (+) Blood dyscrasia, anemia ,   Anesthesia Other Findings   Reproductive/Obstetrics                            Anesthesia Physical Anesthesia Plan  ASA: 2  Anesthesia Plan: General   Post-op Pain Management:    Induction: Intravenous  PONV Risk Score and Plan: 4 or greater and Ondansetron, Dexamethasone, Treatment may vary due to age or medical condition and Scopolamine patch - Pre-op  Airway Management Planned: Oral ETT  Additional Equipment:   Intra-op Plan:   Post-operative Plan: Extubation in OR  Informed Consent: I have reviewed the patients History and Physical, chart, labs and discussed the procedure including the risks, benefits and alternatives for the proposed anesthesia with the patient or authorized representative who has indicated his/her understanding and acceptance.     Dental advisory given  Plan Discussed with: CRNA  Anesthesia Plan Comments: (Patient consented for risks of anesthesia including but not limited to:  - adverse reactions to medications - damage to eyes, teeth,  lips or other oral mucosa - nerve damage due to positioning  - sore throat or hoarseness - damage to heart, brain, nerves, lungs, other parts of body or loss of life  Informed patient about role of CRNA in peri- and intra-operative care.  Patient voiced understanding.)        Anesthesia Quick Evaluation

## 2022-02-21 NOTE — Interval H&P Note (Signed)
History and Physical Interval Note:  02/21/2022 6:48 AM  Molly Gibbs  has presented today for surgery, with the diagnosis of M54.12 cervical radiculopathy R29.898 left arm weakness.  The various methods of treatment have been discussed with the patient and family. After consideration of risks, benefits and other options for treatment, the patient has consented to  Procedure(s): C5-6 ARTHROPLASTY (N/A) as a surgical intervention.  The patient's history has been reviewed, patient examined, no change in status, stable for surgery.  I have reviewed the patient's chart and labs.  Questions were answered to the patient's satisfaction.    Heart sounds normal no MRG. Chest Clear to Auscultation Bilaterally.   Caterina Racine

## 2022-02-21 NOTE — Anesthesia Procedure Notes (Signed)
Procedure Name: Intubation Date/Time: 02/21/2022 7:22 AM  Performed by: Kelton Pillar, CRNAPre-anesthesia Checklist: Patient identified, Emergency Drugs available, Suction available and Patient being monitored Patient Re-evaluated:Patient Re-evaluated prior to induction Oxygen Delivery Method: Circle system utilized Preoxygenation: Pre-oxygenation with 100% oxygen Induction Type: IV induction Ventilation: Mask ventilation without difficulty Laryngoscope Size: McGraph and 3 Grade View: Grade I Tube type: Oral Tube size: 7.0 mm Number of attempts: 1 Airway Equipment and Method: Stylet and Oral airway Placement Confirmation: ETT inserted through vocal cords under direct vision, positive ETCO2, breath sounds checked- equal and bilateral and CO2 detector Secured at: 21 cm Tube secured with: Tape Dental Injury: Teeth and Oropharynx as per pre-operative assessment

## 2022-02-21 NOTE — Transfer of Care (Signed)
Immediate Anesthesia Transfer of Care Note  Patient: Molly Gibbs  Procedure(s) Performed: C5-6 ARTHROPLASTY (Neck)  Patient Location: PACU  Anesthesia Type:General  Level of Consciousness: awake, drowsy and patient cooperative  Airway & Oxygen Therapy: Patient Spontanous Breathing  Post-op Assessment: Report given to RN and Post -op Vital signs reviewed and stable  Post vital signs: Reviewed and stable  Last Vitals:  Vitals Value Taken Time  BP    Temp    Pulse 121 02/21/22 0910  Resp 17 02/21/22 0910  SpO2 96 % 02/21/22 0910  Vitals shown include unvalidated device data.  Last Pain:  Vitals:   02/21/22 0616  TempSrc: Temporal  PainSc: 0-No pain         Complications: No notable events documented.

## 2022-02-21 NOTE — Discharge Instructions (Addendum)
Your surgeon has performed an operation on your cervical spine (neck) to relieve pressure on the spinal cord and/or nerves. This involved making an incision in the front of your neck and removing one or more of the discs that support your spine. Next, a disc replacement was put in to replace your disc.   The following are instructions to help in your recovery once you have been discharged from the hospital. Even if you feel well, it is important that you follow these activity guidelines. If you do not let your neck heal properly from the surgery, you can increase the chance of return of your symptoms and other complications.  Take motrin 800mg  every 8 hours for the next 2 weeks. Take with food. This will help with surgical pain and inflammation.   Activity    No bending, lifting, or twisting ("BLT"). Avoid lifting objects heavier than 10 pounds (gallon milk jug).  Where possible, avoid household activities that involve lifting, bending, reaching, pushing, or pulling such as laundry, vacuuming, grocery shopping, and childcare. Try to arrange for help from friends and family for these activities while your back heals.  Increase physical activity slowly as tolerated.  Taking short walks is encouraged, but avoid strenuous exercise. Do not jog, run, bicycle, lift weights, or participate in any other exercises unless specifically allowed by your doctor.  Talk to your doctor before resuming sexual activity.  You should not drive until cleared by your doctor.  Until released by your doctor, you should not return to work or school.  You should rest at home and let your body heal.   You may shower three days after your surgery.  After showering, lightly dab your incision dry. Do not take a tub bath or go swimming until approved by your doctor at your follow-up appointment.  If you smoke, we strongly recommend that you quit.  Smoking has been proven to interfere with normal bone healing and will  dramatically reduce the success rate of your surgery. Please contact [facility] (800-QUIT-NOW) and use the resources at www.[facility].com for assistance in stopping smoking.  Surgical Incision   If you have a dressing on your incision, you may remove it two days after your surgery. Keep your incision area clean and dry.  You have Dermabond glue on your incision. The glue should begin to peel away within about a week.   Diet           You may return to your usual diet. However, you may experience discomfort when swallowing in the first month after your surgery. This is normal. You may find that softer foods are more comfortable for you to swallow. Be sure to stay hydrated.  When to Contact us  You may experience pain in your neck and/or pain between your shoulder blades. This is normal and should improve in the next few weeks with the help of pain medication, muscle relaxers, and rest. Some patients report that a warm compress on the back of the neck or between the shoulder blades helps.  However, should you experience any of the following, contact us immediately: New numbness or weakness Pain that is progressively getting worse, and is not relieved by your pain medication, muscle relaxers, rest, and warm compresses Bleeding, redness, swelling, pain, or drainage from surgical incision Chills or flu-like symptoms Fever greater than 101.0 F (38.3 C) Inability to eat, drink fluids, or take medications Problems with bowel or bladder functions Difficulty breathing or shortness of breath Warmth, tenderness, or swelling  in your calf Contact Information During office hours (Monday-Friday 9 am to 5 pm), please call your physician at [phone] and ask for Berdine Addison After hours and weekends, please call [phone] and speak with the neurosurgeon on call For a life-threatening emergency, call [place]   The drugs that you were given will stay in  your system until tomorrow so for the next 24 hours you should not:  Drive an automobile Make any legal decisions Drink any alcoholic beverage   You may resume regular meals tomorrow.  Today it is better to start with liquids and gradually work up to solid foods.  You may eat anything you prefer, but it is better to start with liquids, then soup and crackers, and gradually work up to solid foods.   Please notify your doctor immediately if you have any unusual bleeding, trouble breathing, redness and pain at the surgery site, drainage, fever, or pain not relieved by medication.    Additional Instructions:        Please contact your physician with any problems or Same Day Surgery at [phone], Monday through Friday 6 am to 4 pm, or [place] at [facility] number at [phone].

## 2022-02-21 NOTE — Discharge Summary (Addendum)
Physician Discharge Summary  Patient ID: Molly Gibbs MRN: [id number] DOB/AGE: 1990/11/22 31 y.o.  Admit date: 02/21/2022 Discharge date: 02/21/2022  Admission Diagnoses: cervical radiculopathy and left arm weakness  Discharge Diagnoses:  S/P Cervical Disc Arthroplasty at C5/6 using a LDR Mobi-C device  Discharged Condition: good  Hospital Course: Patient admitted for above surgery. She tolerated surgery well and was stable for discharge home.   Consults: None  Significant Diagnostic Studies: none  Treatments: surgery: Cervical Disc Arthroplasty at C5/6 using a LDR Mobi-C device  Discharge Exam: Blood pressure (!) 135/94, pulse (!) 119, temperature 98.5 F (36.9 C), resp. rate 17, height 5\' 3"  (1.6 m), weight 80.7 kg, last menstrual period 01/24/2022, SpO2 99 %. Patient is alert and oriented.  Incision clean dry and intact.  Able to move all 4 extremities.   Disposition: Discharge disposition: 01-Home or Self Care       Discharge Instructions     Discharge patient   Complete by: As directed    Discharge disposition: 01-Home or Self Care   Discharge patient date: 02/21/2022      Allergies as of 02/21/2022       Reactions   Codeine Swelling   swelling   Other Anaphylaxis, Swelling   Peppermint Oil   Imitrex [sumatriptan] Swelling   tongue        Medication List     STOP taking these medications    FLUoxetine 40 MG capsule Commonly known as: PROZAC   nicotine 14 mg/24hr patch Commonly known as: NICODERM CQ - dosed in mg/24 hours       TAKE these medications    gabapentin 300 MG capsule Commonly known as: NEURONTIN Take 300 mg by mouth 2 (two) times daily. 1st dose at 1500   ibuprofen 800 MG tablet Commonly known as: ADVIL Take 1 tablet (800 mg total) by mouth with breakfast, with lunch, and with evening meal. What changed:  medication strength how much to take when to take this reasons to take this   methocarbamol 750 MG  tablet Commonly known as: Robaxin-750 Take 1 tablet (750 mg total) by mouth 3 (three) times daily as needed for muscle spasms.   nitroGLYCERIN 0.4 MG SL tablet Commonly known as: NITROSTAT PLACE 1 TABLET (0.4 MG TOTAL) UNDER THE TONGUE EVERY 5 (FIVE) MINUTES AS NEEDED FOR CHEST PAIN.   oxyCODONE 5 MG immediate release tablet Commonly known as: Oxy IR/ROXICODONE Take 1 tablet (5 mg total) by mouth every 4 (four) hours as needed for severe pain.        Follow-up Information     Geronimo Boot, PA-C Follow up.   Specialty: Neurosurgery Why: Keep scheduled postop appointment on 03/08/22. Contact information: [address] [place] [address] [phone]                 Signed: [name] 02/21/2022, 9:13 AM

## 2022-02-21 NOTE — Op Note (Signed)
Indications: Molly Gibbs is a 31 yo female who presented with: M54.12 cervical radiculopathy, R29.898 left arm weakness.  Due to weakness and failure of conservative management, surgical intervention was recommended  Findings: disc herniation C5-6  Preoperative Diagnosis: M54.12 cervical radiculopathy, R29.898 left arm weakness Postoperative Diagnosis: same   EBL: 20 ml IVF: 1000 ml Drains: none Disposition: Extubated and Stable to PACU Complications: none  No foley catheter was placed.   Preoperative Note:   Risks of surgery discussed include: infection, bleeding, stroke, coma, death, paralysis, CSF leak, nerve/spinal cord injury, numbness, tingling, weakness, complex regional pain syndrome, recurrent stenosis and/or disc herniation, vascular injury, development of instability, neck/back pain, need for further surgery, persistent symptoms, development of deformity, and the risks of anesthesia. The patient understood these risks and agreed to proceed.  Operative Note:  Procedure:  1) Cervical Disc Arthroplasty at C5/6 using a LDR Mobi-C device   Procedure: After obtaining informed consent, the patient taken to the operating room, placed in supine position, general anesthesia induced.  The patient had a small shoulder roll placed behind the neck.  The patient received preop antibiotics and IV Decadron.  The patient had a neck incision outlined, was prepped and draped in usual sterile fashion. The incision was injected with local anesthetic.   An incision was opened, dissection taken down medial to the carotid artery and jugular vein, lateral to the trachea and esophagus.  The prevertebral fascia identified and a localizing x-ray demonstrated the correct level.  The longus colli were dissected laterally, and self-retaining retractors placed to open the operative field. The microscope was then brought into the field.  With this complete, distractor pins were placed in the vertebral bodies of  C5 and C6. The distractor was placed, and the anulus at C5/6 was opened using a bovie.  Curettes and pituitary rongeurs used to remove the majority of disk, then the drill was used to remove the posterior osteophyte and begin the foraminotomies. The nerve hook was used to elevate the posterior longitudinal ligament, which was then removed with Kerrison rongeurs. The microblunt nerve hook could be passed out the foramen bilaterally.   Meticulous hemostasis was obtained.  A trial spacer was used to size the disc space. Using flouroscopic guidance, a 15 mm width x 13 mm depth x 6 mm height Mobi-C was then inserted in the prepared disc space.  The caspar distractor was removed, and bone wax used for hemostasis. Final AP and lateral radiographs were taken.   With the disc arthroplasty in good position, the wound was irrigated copiously and meticulous hemostasis obtained.  Wound was closed in 2 layers using interrupted inverted 3-0 Vicryl sutures.  The wound was dressed with dermabond, the head of bed at 30 degrees, taken to recovery room in stable condition.  No new postop neurological deficits were identified.  Sponge and pattie counts were correct at the end of the procedure.     I performed the entire procedure with the assistance of Geronimo Boot PA as an Pensions consultant. An assistant was required for this procedure due to the complexity.  The assistant provided assistance in tissue manipulation and suction, and was required for the successful and safe performance of the procedure. I performed the critical portions of the procedure.   Meade Maw MD

## 2022-02-22 ENCOUNTER — Encounter: Payer: Self-pay | Admitting: Neurosurgery

## 2022-02-22 ENCOUNTER — Ambulatory Visit: Payer: BC Managed Care – PPO | Admitting: Family

## 2022-02-22 NOTE — Anesthesia Postprocedure Evaluation (Signed)
Anesthesia Post Note  Patient: Molly Gibbs  Procedure(s) Performed: C5-6 ARTHROPLASTY (Neck)  Patient location during evaluation: PACU Anesthesia Type: General Level of consciousness: awake and alert, oriented and patient cooperative Pain management: pain level controlled Vital Signs Assessment: post-procedure vital signs reviewed and stable Respiratory status: spontaneous breathing, nonlabored ventilation and respiratory function stable Cardiovascular status: blood pressure returned to baseline and stable Postop Assessment: adequate PO intake Anesthetic complications: no   No notable events documented.   Last Vitals:  Vitals:   02/21/22 1049 02/21/22 1200  BP: 118/83 113/82  Pulse: 81 96  Resp: 15 14  Temp:    SpO2: 97% 97%    Last Pain:  Vitals:   02/21/22 1200  TempSrc:   PainSc: 0-No pain                 Darrin Nipper

## 2022-02-26 ENCOUNTER — Encounter: Payer: Self-pay | Admitting: Neurosurgery

## 2022-03-06 ENCOUNTER — Encounter: Payer: Self-pay | Admitting: Neurosurgery

## 2022-03-06 DIAGNOSIS — M5412 Radiculopathy, cervical region: Secondary | ICD-10-CM

## 2022-03-06 DIAGNOSIS — Z9889 Other specified postprocedural states: Secondary | ICD-10-CM

## 2022-03-06 MED ORDER — METHYLPREDNISOLONE 4 MG PO TBPK: ORAL_TABLET | ORAL | 0 refills | Status: DC

## 2022-03-06 NOTE — Telephone Encounter (Signed)
Spoke with patient. Has itchy red rash- see picture.   No SOB, tongue swelling.   Reviewed with Dr. Izora Ribas. Will do medrol dose pack. Can do OTC lotion on rash, but nothing over incision.   Incision looks good. She states the glue is gone.   Keep follow up on Thursday. Go to ED if any SOB, tongue swelling.

## 2022-03-07 NOTE — Progress Notes (Signed)
   REFERRING PHYSICIAN:  Maximiano Coss, [address] [place],   [address]  DOS: 02/21/22 Cervical Disc Arthroplasty at C5-C6  HISTORY OF PRESENT ILLNESS: Molly Gibbs is 2 weeks status post Cervical Disc Arthroplasty at C5-C6. Given motrin, robaxin, and oxycodone on discharge from the hospital.   She called on 03/06/22 with rash and a medrol dose pack was called in for her. She has seem slight improvement in rash. It is not progressing, but is still itchy.   Her preop left arm pain is gone. She has some mild right shoulder pain. No numbness or tingling.   Has not taken oxycodone in over a week. Is taking prn robaxin.    PHYSICAL EXAMINATION:  NEUROLOGICAL:  General: In no acute distress.   Awake, alert, oriented to person, place, and time.  Pupils equal round and reactive to light.  Facial tone is symmetric.    Strength: Side Biceps Triceps Deltoid Interossei Grip Wrist Ext. Wrist Flex.  R 5 5 5 5 5 5 5   L 5 5 5 5 5 5 5    Incision c/d/I  She has a red papular rash that is around the incision, on her anterior neck up into her face and down into mid chest region.   No signs of infection.   Imaging:  Nothing new to review.   Assessment / Plan: Molly Gibbs is doing well s/p above surgery. Treatment options reviewed with patient and following plan made:   - We discussed activity escalation and I have advised the patient to lift up to 10 pounds until 6 weeks after surgery.  - Reviewed wound care.  - Follow up with PCP regarding rash.  - Finish out dose pack then can restart motrin. Okay to continue prn robaxin.  - Okay to return to work for 4 hours a day starting Monday 03/12/22 per Dr. Izora Ribas. Works in [employer]. Note given.  - Follow up as scheduled in 4 weeks and prn.   Advised to contact the office if any questions or concerns arise.   Geronimo Boot PA-C Dept of Neurosurgery

## 2022-03-08 ENCOUNTER — Ambulatory Visit (INDEPENDENT_AMBULATORY_CARE_PROVIDER_SITE_OTHER): Payer: BC Managed Care – PPO | Admitting: Orthopedic Surgery

## 2022-03-08 ENCOUNTER — Encounter: Payer: Self-pay | Admitting: Orthopedic Surgery

## 2022-03-08 VITALS — BP 112/78 | Temp 98.1°F | Ht 63.0 in | Wt 177.0 lb

## 2022-03-08 DIAGNOSIS — Z09 Encounter for follow-up examination after completed treatment for conditions other than malignant neoplasm: Secondary | ICD-10-CM

## 2022-03-08 DIAGNOSIS — Z9889 Other specified postprocedural states: Secondary | ICD-10-CM

## 2022-03-08 DIAGNOSIS — M5412 Radiculopathy, cervical region: Secondary | ICD-10-CM

## 2022-03-16 NOTE — Telephone Encounter (Signed)
Cervical disc arthroplasty C5-C5 on 02/21/22.

## 2022-04-02 ENCOUNTER — Other Ambulatory Visit: Payer: Self-pay

## 2022-04-02 DIAGNOSIS — Z9889 Other specified postprocedural states: Secondary | ICD-10-CM

## 2022-04-03 ENCOUNTER — Ambulatory Visit (INDEPENDENT_AMBULATORY_CARE_PROVIDER_SITE_OTHER): Payer: BC Managed Care – PPO | Admitting: Family

## 2022-04-03 ENCOUNTER — Encounter: Payer: Self-pay | Admitting: Family

## 2022-04-03 VITALS — BP 108/60 | HR 86 | Temp 98.1°F | Ht 63.0 in | Wt 185.0 lb

## 2022-04-03 DIAGNOSIS — R5383 Other fatigue: Secondary | ICD-10-CM | POA: Diagnosis not present

## 2022-04-03 DIAGNOSIS — R635 Abnormal weight gain: Secondary | ICD-10-CM | POA: Diagnosis not present

## 2022-04-03 DIAGNOSIS — R21 Rash and other nonspecific skin eruption: Secondary | ICD-10-CM | POA: Diagnosis not present

## 2022-04-03 LAB — CBC WITH DIFFERENTIAL/PLATELET
Basophils Absolute: 0 10*3/uL (ref 0.0–0.1)
Basophils Relative: 0.5 % (ref 0.0–3.0)
Eosinophils Absolute: 0.2 10*3/uL (ref 0.0–0.7)
Eosinophils Relative: 2.5 % (ref 0.0–5.0)
HCT: 38.6 % (ref 36.0–46.0)
Hemoglobin: 12.9 g/dL (ref 12.0–15.0)
Lymphocytes Relative: 19.9 % (ref 12.0–46.0)
Lymphs Abs: 1.6 10*3/uL (ref 0.7–4.0)
MCHC: 33.5 g/dL (ref 30.0–36.0)
MCV: 92.2 fl (ref 78.0–100.0)
Monocytes Absolute: 0.7 10*3/uL (ref 0.1–1.0)
Monocytes Relative: 8.3 % (ref 3.0–12.0)
Neutro Abs: 5.5 10*3/uL (ref 1.4–7.7)
Neutrophils Relative %: 68.8 % (ref 43.0–77.0)
Platelets: 288 10*3/uL (ref 150.0–400.0)
RBC: 4.18 Mil/uL (ref 3.87–5.11)
RDW: 12.4 % (ref 11.5–15.5)
WBC: 8 10*3/uL (ref 4.0–10.5)

## 2022-04-03 LAB — TSH: TSH: 1.48 u[IU]/mL (ref 0.35–5.50)

## 2022-04-03 LAB — COMPREHENSIVE METABOLIC PANEL
ALT: 31 U/L (ref 0–35)
AST: 19 U/L (ref 0–37)
Albumin: 4.5 g/dL (ref 3.5–5.2)
Alkaline Phosphatase: 71 U/L (ref 39–117)
BUN: 9 mg/dL (ref 6–23)
CO2: 29 mEq/L (ref 19–32)
Calcium: 9.4 mg/dL (ref 8.4–10.5)
Chloride: 102 mEq/L (ref 96–112)
Creatinine, Ser: 0.51 mg/dL (ref 0.40–1.20)
GFR: 122.89 mL/min (ref >60.00)
Glucose, Bld: 77 mg/dL (ref 70–99)
Potassium: 3.9 mEq/L (ref 3.5–5.1)
Sodium: 139 mEq/L (ref 135–145)
Total Bilirubin: 0.4 mg/dL (ref 0.2–1.2)
Total Protein: 7.1 g/dL (ref 6.0–8.3)

## 2022-04-03 LAB — HEMOGLOBIN A1C: Hgb A1c MFr Bld: 5.3 % (ref 4.6–6.5)

## 2022-04-03 NOTE — Progress Notes (Signed)
Molly Gibbs is a 31 y.o. female with the following history as recorded in EpicCare:  Patient Active Problem List   Diagnosis Date Noted   Cervical radiculopathy    Left arm weakness    Atypical chest pain 07/27/2019   Mixed hyperlipidemia 07/27/2019   Tobacco use 04/09/2019   Dyslipidemia (high LDL; low HDL) 04/09/2019   Obesity (BMI 30-39.9) 04/09/2019   Chest pain of uncertain etiology 62/83/6629   Palpitations 04/09/2019   Knee pain 06/27/2012   Asthma 06/27/2011    Current Outpatient Medications  Medication Sig Dispense Refill   ibuprofen (ADVIL) 800 MG tablet Take 1 tablet (800 mg total) by mouth with breakfast, with lunch, and with evening meal. 90 tablet 0   methocarbamol (ROBAXIN-750) 750 MG tablet Take 1 tablet (750 mg total) by mouth 3 (three) times daily as needed for muscle spasms. 90 tablet 0   nitroGLYCERIN (NITROSTAT) 0.4 MG SL tablet PLACE 1 TABLET (0.4 MG TOTAL) UNDER THE TONGUE EVERY 5 (FIVE) MINUTES AS NEEDED FOR CHEST PAIN. (Patient not taking: Reported on 04/03/2022) 25 tablet 0   No current facility-administered medications for this visit.    Allergies: Codeine, Other, and Imitrex [sumatriptan]  Past Medical History:  Diagnosis Date   Anemia    Anxiety    Asthma    asthmatic bronchitis no inhaler   Blood transfusion without reported diagnosis    Cervical radiculopathy    Chest pain of uncertain etiology    Complication of anesthesia    Depression    GERD (gastroesophageal reflux disease)    Hyperlipidemia    Obesity    PONV (postoperative nausea and vomiting)    with c section only   Tobacco use     Past Surgical History:  Procedure Laterality Date   CERVICAL DISC ARTHROPLASTY N/A 02/21/2022   Procedure: C5-6 ARTHROPLASTY;  Surgeon: Meade Maw, MD;  Location: [facility] ORS;  Service: Neurosurgery;  Laterality: N/A;   CESAREAN SECTION     x1   DILATION AND EVACUATION N/A 10/29/2012   Procedure: DILATATION AND EVACUATION WITH ULTRASOUND;   Surgeon: Floyce Stakes. Pamala Hurry, MD;  Location: [facility];  Service: Gynecology;  Laterality: N/A;  CHROMOSOME STUDIES;POSSIBLE MOLAR PREGNANCY.   Bladder instillation performed    Family History  Problem Relation Age of Onset   Arthritis Maternal Grandmother        Rheumatoid arthritis   Breast cancer Maternal Grandmother        breast   Heart disease Maternal Grandmother    Arthritis Maternal Grandfather    Arthritis Paternal Grandmother        Osteoarthritis   Heart disease Paternal Grandmother    Atrial fibrillation Paternal Grandmother    Arthritis Paternal Grandfather        Osteoarthritis   Lung cancer Paternal Grandfather        lung    Social History   Tobacco Use   Smoking status: Former    Packs/day: 1.00    Years: 9.00    Total pack years: 9.00    Types: Cigarettes    Quit date: 02/08/2022    Years since quitting: 0.1   Smokeless tobacco: Never  Substance Use Topics   Alcohol use: Not Currently    Subjective:  Presents today as a new patient; transferring from PCP who has left [place] office;  Had spinal surgery at end of September- developed rash at neck/ upper chest but surgeon unsure what caused rash; was told that needed to follow up with  PCP to determine source of rash; was treated with Medrol Dose Pak; question raised if it could have been betadine or glue used in surgery;   Also concerned about weight; has quit smoking in the past 2 months; wondered if could be related to her Gabapentin which she has stopped recently; would be interested in re-starting Prozac possibly as she felt like her weight was more stable on Prozac; would also be interested in meeting with [facility];     Objective:  Vitals:   04/03/22 1322  BP: 108/60  Pulse: 86  Temp: 98.1 F (36.7 C)  TempSrc: Oral  SpO2: 98%  Weight: 185 lb (83.9 kg)  Height: _0  (1.6 m)    General: Well developed, well nourished, in no acute distress  Skin : Warm and dry.  Head:  Normocephalic and atraumatic  Eyes: Sclera and conjunctiva clear; pupils round and reactive to light; extraocular movements intact  Ears: External normal; canals clear; tympanic membranes normal  Oropharynx: Pink, supple. No suspicious lesions  Neck: Supple with questionable thyromegaly, no adenopathy  Lungs: Respirations unlabored; clear to auscultation bilaterally without wheeze, rales, rhonchi  CVS exam: normal rate and regular rhythm.  Neurologic: Alert and oriented; speech intact; face symmetrical; moves all extremities well; CNII-XII intact without focal deficit   Assessment:  1. Rash   2. Other fatigue   3. Weight gain     Plan:  Will refer to allergist; patient will re-address possible source of rash with surgeon; ? Betadine vs glue;  Will update labs today- assuming normal, will re-start Prozac 20 mg;  Will consider referral to [facility]; will also need to consider thyroid ultrasound assuming surgeon is comfortable with this test being done.   No follow-ups on file.  Orders Placed This Encounter  Procedures   CBC with Differential/Platelet   Comp Met (CMET)   TSH   Hemoglobin A1c   Ambulatory referral to Allergy    Referral Priority:   Routine    Referral Type:   Allergy Testing    Referral Reason:   Specialty Services Required    Requested Specialty:   Allergy    Number of Visits Requested:   1    Requested Prescriptions    No prescriptions requested or ordered in this encounter

## 2022-04-04 ENCOUNTER — Other Ambulatory Visit: Payer: Self-pay | Admitting: Family

## 2022-04-04 DIAGNOSIS — Z6832 Body mass index (BMI) 32.0-32.9, adult: Secondary | ICD-10-CM

## 2022-04-04 MED ORDER — FLUOXETINE HCL 20 MG PO CAPS: 20.0000 mg | ORAL_CAPSULE | Freq: Every day | ORAL | 0 refills | Status: DC

## 2022-04-05 ENCOUNTER — Ambulatory Visit (INDEPENDENT_AMBULATORY_CARE_PROVIDER_SITE_OTHER): Payer: BC Managed Care – PPO | Admitting: Neurosurgery

## 2022-04-05 ENCOUNTER — Ambulatory Visit
Admission: RE | Admit: 2022-04-05 | Discharge: 2022-04-05 | Disposition: A | Discharge status: Home / Self Care | Payer: BC Managed Care – PPO | Source: Ambulatory Visit | Attending: Neurosurgery | Admitting: Neurosurgery

## 2022-04-05 ENCOUNTER — Ambulatory Visit
Admission: RE | Admit: 2022-04-05 | Discharge: 2022-04-05 | Disposition: A | Discharge status: Home / Self Care | Payer: BC Managed Care – PPO | Attending: Neurosurgery | Admitting: Neurosurgery

## 2022-04-05 ENCOUNTER — Encounter: Payer: Self-pay | Admitting: Neurosurgery

## 2022-04-05 VITALS — BP 115/81 | HR 76 | Temp 98.4°F | Wt 183.0 lb

## 2022-04-05 DIAGNOSIS — Z981 Arthrodesis status: Secondary | ICD-10-CM | POA: Diagnosis not present

## 2022-04-05 DIAGNOSIS — Z9889 Other specified postprocedural states: Secondary | ICD-10-CM

## 2022-04-05 DIAGNOSIS — M5412 Radiculopathy, cervical region: Secondary | ICD-10-CM

## 2022-04-05 DIAGNOSIS — Z09 Encounter for follow-up examination after completed treatment for conditions other than malignant neoplasm: Secondary | ICD-10-CM

## 2022-04-05 NOTE — Progress Notes (Signed)
   REFERRING PHYSICIAN:  Janeece Agee, [address] [place],  [place] [address]  DOS: 02/21/22 Cervical Disc Arthroplasty at C5-C6  HISTORY OF PRESENT ILLNESS: Molly Gibbs is status post Cervical Disc Arthroplasty at C5-C6.  She is doing extremely well.  PHYSICAL EXAMINATION:  NEUROLOGICAL:  General: In no acute distress.   Awake, alert, oriented to person, place, and time.  Pupils equal round and reactive to light.  Facial tone is symmetric.    Strength: Side Biceps Triceps Deltoid Interossei Grip Wrist Ext. Wrist Flex.  R 5 5 5 5 5 5 5   L 5 5 5 5 5 5 5    Incision c/d/I     Imaging:  No complications noted  Assessment / Plan: Molly Gibbs is doing well s/p above surgery.   We reviewed activity limitations.  She is not a 25 pound lifting limit.  She can start back exercising.  We reviewed the rash she had after surgery.  I am suspicious as to whether the ChloraPrep we used for surgical prep may be the culprit.  We used the orange-colored ChloraPrep.  I will see her back in 6 weeks with x-rays.  MD Dept of Neurosurgery

## 2022-04-06 ENCOUNTER — Telehealth: Payer: Self-pay | Admitting: Family

## 2022-04-06 DIAGNOSIS — E049 Nontoxic goiter, unspecified: Secondary | ICD-10-CM

## 2022-04-06 NOTE — Telephone Encounter (Signed)
I have called pt and there was no answer so I left a message to call back.  ?

## 2022-04-06 NOTE — Telephone Encounter (Signed)
Please let her know that surgeon agreed we should get thyroid ultrasound; will get that ordered for her. She will be contacted to schedule.

## 2022-04-06 NOTE — Telephone Encounter (Signed)
-----   Message from Venetia Night, MD sent at 04/05/2022 10:04 AM EST ----- Hey-  I just saw her in clinic.  I think she is doing extremely well from surgery.  Her symptoms are almost entirely resolved.  I think you should move forward on checking out her thyroid.  I do not think the fullness in her neck is secondary to her incision at this point.  Given the weight gain as well, it makes sense to consider thyroid.  If she needs a biopsy or some invasive test in her neck, that would be fine with me at this point.  I was not anywhere close to her thyroid gland during the surgery.  Thanks, Annia Friendly ----- Message ----- From: [name], FNP Sent: 04/04/2022   2:52 PM EST To: Venetia Night, MD  Good afternoon-  This lovely patient established with me yesterday for primary care. I was concerned about swelling at the right side of her neck and was considering a thyroid ultrasound. However, I know she is seeing you tomorrow for post-op check on the incision. Would you be willing to give me your opinion on the ultrasound necessity or if what we are seeing is related to post-surgical healing?  Thank you, Ria Clock, FNP

## 2022-04-09 NOTE — Telephone Encounter (Signed)
I have called pt and relayed the message from the provider. She has it scheduled for Thursday.

## 2022-04-10 ENCOUNTER — Encounter (INDEPENDENT_AMBULATORY_CARE_PROVIDER_SITE_OTHER): Payer: Self-pay

## 2022-04-12 ENCOUNTER — Ambulatory Visit (HOSPITAL_BASED_OUTPATIENT_CLINIC_OR_DEPARTMENT_OTHER)
Admission: RE | Admit: 2022-04-12 | Discharge: 2022-04-12 | Disposition: A | Discharge status: Home / Self Care | Payer: BC Managed Care – PPO | Source: Ambulatory Visit | Attending: Family | Admitting: Family

## 2022-04-12 DIAGNOSIS — E049 Nontoxic goiter, unspecified: Secondary | ICD-10-CM | POA: Insufficient documentation

## 2022-04-23 ENCOUNTER — Encounter: Payer: Self-pay | Admitting: Neurosurgery

## 2022-04-23 DIAGNOSIS — M5412 Radiculopathy, cervical region: Secondary | ICD-10-CM

## 2022-04-23 DIAGNOSIS — Z9889 Other specified postprocedural states: Secondary | ICD-10-CM

## 2022-04-23 NOTE — Telephone Encounter (Signed)
02/21/22 Cervical Disc Arthroplasty at C5-C6   Had medrol dose pack on 03/06/22 to help with postop pain.

## 2022-04-24 ENCOUNTER — Ambulatory Visit (INDEPENDENT_AMBULATORY_CARE_PROVIDER_SITE_OTHER): Payer: BC Managed Care – PPO | Admitting: Internal Medicine

## 2022-04-24 ENCOUNTER — Encounter: Payer: Self-pay | Admitting: Internal Medicine

## 2022-04-24 VITALS — BP 112/68 | HR 88 | Temp 98.1°F | Resp 18 | Ht 63.0 in | Wt 181.5 lb

## 2022-04-24 DIAGNOSIS — Z91018 Allergy to other foods: Secondary | ICD-10-CM

## 2022-04-24 DIAGNOSIS — T783XXA Angioneurotic edema, initial encounter: Secondary | ICD-10-CM

## 2022-04-24 DIAGNOSIS — L2389 Allergic contact dermatitis due to other agents: Secondary | ICD-10-CM | POA: Diagnosis not present

## 2022-04-24 MED ORDER — CHLORHEXIDINE GLUCONATE 0.12 % MT SOLN: 15.0000 mL | Freq: Two times a day (BID) | OROMUCOSAL | 0 refills | Status: DC

## 2022-04-24 MED ORDER — METHYLPREDNISOLONE 4 MG PO TBPK: ORAL_TABLET | ORAL | 0 refills | Status: DC

## 2022-04-24 MED ORDER — CHLORHEXIDINE GLUCONATE 4 % EX LIQD: Freq: Every day | CUTANEOUS | 0 refills | Status: DC | PRN

## 2022-04-24 NOTE — Patient Instructions (Addendum)
Post operative Rash  - Differential diagnosis includes allergic contact dermatitis to prosthetic disk versus chloraprep.   - Given concern for ongoing inflammation in neck, instructed patient to take prednisone prescribed today  - Will have her return to clinic the week of May 14, 2022 for metals testing + chlorhexidine  - Given holidays she is instructed to take pictures of her back on Dec 25th and RTC on the 27th for final evaluation.   Discussed with patient that patch testing tests for contact dermatitis and sometimes it does not correlate to how one will react to metals in the body. Positive patch testing results can help in avoiding those items however it is possible to get false negative results.  Nevertheless, this is the most accessible test for metal sensitivity currently available.    Food allergy  - No formal testing for peppermint exists.  - Continue to avoid peppermint containing products  - Continue to carry epipen  - We can consider prick prick testing to peppermint oil in the future   Drug Allergy  - Continue to avoid imitrex and codeine   Follow up: week of DEC 18th for metals patch testing.   Thank you so much for letting me partake in your care today.  Don't hesitate to reach out if you have any additional concerns!  Ferol Luz, MD  [facility] [place], [place]

## 2022-04-24 NOTE — Addendum Note (Signed)
Addended byDrake Leach on: 04/24/2022 08:07 AM   Modules accepted: Orders

## 2022-04-24 NOTE — Progress Notes (Signed)
New Patient Note  RE: Molly Gibbs MRN: [id number] DOB: 10/12/1990 Date of Office Visit: 04/24/2022  Consult requested by: [name],* Primary care provider: [name], FNP  Chief Complaint: Establish Care and Rash  History of Present Illness: I had the pleasure of seeing Molly Gibbs for initial evaluation at the [facility] on 04/24/2022. She is a 31 y.o. female, who is referred here by [name], FNP for the evaluation of possible metal/prosethetic joint allergy.  History obtained from patient, chart review .  She had an artificial disk replacement on 02/21/21 which contain titanium, plastic and floppy. The did use chloraprep during surgery.    She was also taking 800mg  ibuprofen, robaxin 750mg , oxycodone (only for the first week).  Symptoms started after she stopped oxycodone.  Symptoms were pruritic erythema with mild blisters starting around incision site and spreading down chest.  She was treated with 10 days of prednisone and benadryl with moderate improvement.  She had been off ibuprofen and robaxin for 15 days and no response with her rash.   She was prescribed prednisone today by primary care, but has no started burst yet.  This was prescribed due to her "twinging" her neck over the weekend  causing increased neck pain and radicular pain down right arm.  Surgeons concerned for inflammation and requested her to restart ibuprofen and robaxin as well as prednisone.   She has had a previous c section, but she does not remember when prep was used.  She denies any prior implants or prostheses.    She has a history of peppermint oil and previously carried epipen.  Has since avoided anything with peppermint.   She also has drug allergies to imitrex and codeine which results in angioedema of mouth and tongue   She has a history of asthma bronchitis when she was an active smoker, which resolved when she quit smoking.     Assessment and Plan: [name] is a 31 y.o. female with: Allergic contact dermatitis due to other agents  History of food allergy  Angioedema, initial encounter Plan: Patient Instructions   Post operative Rash  - Differential diagnosis includes allergic contact dermatitis to prosthetic disk versus chloraprep.   - Given concern for ongoing inflammation in neck, instructed patient to take prednisone prescribed today  - Will have her return to clinic the week of May 14, 2022 for metals testing + chlorhexidine  - Given holidays she is instructed to take pictures of her back on Dec 25th and RTC on the 27th for final evaluation.   Discussed with patient that patch testing tests for contact dermatitis and sometimes it does not correlate to how one will react to metals in the body. Positive patch testing results can help in avoiding those items however it is possible to get false negative results.  Nevertheless, this is the most accessible test for metal sensitivity currently available.    Food allergy  - No formal testing for peppermint exists.  - Continue to avoid peppermint containing products  - Continue to carry epipen  - We can consider prick prick testing to peppermint oil in the future   Drug Allergy  - Continue to avoid imitrex and codeine   Follow up: week of DEC 18th for metals patch testing.   Thank you so much for letting me partake in your care today.  Don't hesitate to reach out if you have any additional concerns!  28, MD  [facility] [place]    Meds ordered this encounter  Medications   chlorhexidine (PERIDEX) 0.12 % solution    Sig: Use as directed 15 mLs in the mouth or throat 2 (two) times daily.    Dispense:  120 mL    Refill:  0   chlorhexidine (HIBICLENS) 4 % external liquid    Sig: Apply topically daily as needed.    Dispense:  120 mL    Refill:  0   Lab Orders  No laboratory test(s) ordered today    Other allergy  screening: Asthma: yes Rhino conjunctivitis: no Food allergy: yes Medication allergy: yes Hymenoptera allergy: no Urticaria: no Eczema:no History of recurrent infections suggestive of immunodeficency: no  Diagnostics: None    Past Medical History: Patient Active Problem List   Diagnosis Date Noted   Cervical radiculopathy    Left arm weakness    Atypical chest pain 07/27/2019   Mixed hyperlipidemia 07/27/2019   Tobacco use 04/09/2019   Dyslipidemia (high LDL; low HDL) 04/09/2019   Obesity (BMI 30-39.9) 04/09/2019   Chest pain of uncertain etiology 04/09/2019   Palpitations 04/09/2019   Knee pain 06/27/2012   Asthma 06/27/2011   Past Medical History:  Diagnosis Date   Anemia    Anxiety    Asthma    asthmatic bronchitis no inhaler   Blood transfusion without reported diagnosis    Cervical radiculopathy    Chest pain of uncertain etiology    Complication of anesthesia    Depression    GERD (gastroesophageal reflux disease)    Hyperlipidemia    Obesity    PONV (postoperative nausea and vomiting)    with c section only   Tobacco use    Past Surgical History: Past Surgical History:  Procedure Laterality Date   CERVICAL DISC ARTHROPLASTY N/A 02/21/2022   Procedure: C5-6 ARTHROPLASTY;  Surgeon: Venetia Night, MD;  Location: [facility] ORS;  Service: Neurosurgery;  Laterality: N/A;   CESAREAN SECTION     x1   DILATION AND EVACUATION N/A 10/29/2012   Procedure: DILATATION AND EVACUATION WITH ULTRASOUND;  Surgeon: Alphonsus Sias. Ernestina Penna, MD;  Location: [facility] ORS;  Service: Gynecology;  Laterality: N/A;  CHROMOSOME STUDIES;POSSIBLE MOLAR PREGNANCY.   Bladder instillation performed   Medication List:  Current Outpatient Medications  Medication Sig Dispense Refill   chlorhexidine (HIBICLENS) 4 % external liquid Apply topically daily as needed. 120 mL 0   chlorhexidine (PERIDEX) 0.12 % solution Use as directed 15 mLs in the mouth or throat 2 (two) times daily. 120 mL 0    FLUoxetine (PROZAC) 20 MG capsule Take 1 capsule (20 mg total) by mouth daily. 90 capsule 0   ibuprofen (ADVIL) 800 MG tablet Take 1 tablet (800 mg total) by mouth with breakfast, with lunch, and with evening meal. 90 tablet 0   methocarbamol (ROBAXIN-750) 750 MG tablet Take 1 tablet (750 mg total) by mouth 3 (three) times daily as needed for muscle spasms. 90 tablet 0   methylPREDNISolone (MEDROL DOSEPAK) 4 MG TBPK tablet Use as directed x 6 days. (Patient not taking: Reported on 04/24/2022) 1 each 0   No current facility-administered medications for this visit.   Allergies: Allergies  Allergen Reactions   Codeine Swelling    swelling   Other Anaphylaxis and Swelling    Peppermint Oil   Imitrex [Sumatriptan] Swelling    tongue   Social History: Social History   Socioeconomic History   Marital status: Married    Spouse name: Not  on file   Number of children: 1   Years of education: College   Highest education level: Not on file  Occupational History    Employer: [employer]    Comment: [employer]  Tobacco Use   Smoking status: Former    Packs/day: 1.00    Years: 9.00    Total pack years: 9.00    Types: Cigarettes    Quit date: 02/08/2022    Years since quitting: 0.2   Smokeless tobacco: Never  Vaping Use   Vaping Use: Never used  Substance and Sexual Activity   Alcohol use: Not Currently   Drug use: No   Sexual activity: Yes    Birth control/protection: Inserts  Other Topics Concern   Not on file  Social History Narrative   Patient lives at home with her daughter.   Caffeine Use: 2 sodas daily   Social Determinants of Health   Financial Resource Strain: Not on file  Food Insecurity: Not on file  Transportation Needs: Not on file  Physical Activity: Not on file  Stress: Not on file  Social Connections: Not on file   Lives in a single-family home that is 33 years old.  There are no roaches in the house and bed is 2 feet off the floor.  Dust mite  precautions on bed but not pillows.  She is not exposed to fumes, chemicals or dust.  There is no HEPA filter in the home and home is not near an interstate industrial area. Smoking: Prior smoker started in 2013, quit in September 2023 Occupation: Works in [employer]  Environmental History: ImmunologistWater Damage/mildew in the house: no Engineer, civil (consulting)Carpet in the family room: no Carpet in the bedroom: no Heating: electric Cooling: central Pet: yes 1 cat and 1 dog inside the home, cat has access to bedroom  Family History: Family History  Problem Relation Age of Onset   Arthritis Maternal Grandmother        Rheumatoid arthritis   Breast cancer Maternal Grandmother        breast   Heart disease Maternal Grandmother    Arthritis Maternal Grandfather    Arthritis Paternal Grandmother        Osteoarthritis   Heart disease Paternal Grandmother    Atrial fibrillation Paternal Grandmother    Arthritis Paternal Grandfather        Osteoarthritis   Lung cancer Paternal Grandfather        lung     ROS: All others negative except as noted per HPI.   Objective: BP 112/68 (BP Location: Right Arm, Patient Position: Sitting, Cuff Size: Normal)   Pulse 88   Temp 98.1 F (36.7 C) (Temporal)   Resp 18   Ht 5\' 3"  (1.6 m)   Wt 181 lb 8 oz (82.3 kg)   LMP 04/05/2022   SpO2 96%   BMI 32.15 kg/m  Body mass index is 32.15 kg/m.  General Appearance:  Alert, cooperative, no distress, appears stated age  Head:  Normocephalic, without obvious abnormality, atraumatic  Eyes:  Conjunctiva clear, EOM's intact  Nose: Nares normal,   Throat: Lips, tongue normal; teeth and gums normal,   Neck: Supple, symmetrical  Lungs:   , Respirations unlabored, no coughing  Heart:  , Appears well perfused  Extremities: No edema  Skin: Skin color, texture, turgor normal,  erythema on chest, slight erythema on anterior cervical incision   Neurologic: No gross deficits   The plan was reviewed with the patient/family, and all  questions/concerned were  addressed.  It was my pleasure to see [place] today and participate in her care. Please feel free to contact me with any questions or concerns.  Sincerely,  Ferol Luz, MD Allergy & Immunology  [facility] office: [phone] [facility] office: [phone]

## 2022-05-11 ENCOUNTER — Other Ambulatory Visit: Payer: Self-pay

## 2022-05-11 DIAGNOSIS — Z9889 Other specified postprocedural states: Secondary | ICD-10-CM

## 2022-05-14 ENCOUNTER — Ambulatory Visit: Payer: BC Managed Care – PPO | Admitting: Internal Medicine

## 2022-05-15 ENCOUNTER — Ambulatory Visit (INDEPENDENT_AMBULATORY_CARE_PROVIDER_SITE_OTHER): Payer: BC Managed Care – PPO | Admitting: Neurosurgery

## 2022-05-15 ENCOUNTER — Ambulatory Visit
Admission: RE | Admit: 2022-05-15 | Discharge: 2022-05-15 | Disposition: A | Discharge status: Home / Self Care | Payer: BC Managed Care – PPO | Source: Ambulatory Visit | Attending: Neurosurgery | Admitting: Neurosurgery

## 2022-05-15 ENCOUNTER — Encounter: Payer: Self-pay | Admitting: Neurosurgery

## 2022-05-15 VITALS — BP 114/77 | HR 72 | Temp 98.4°F | Wt 184.4 lb

## 2022-05-15 DIAGNOSIS — Z09 Encounter for follow-up examination after completed treatment for conditions other than malignant neoplasm: Secondary | ICD-10-CM

## 2022-05-15 DIAGNOSIS — M542 Cervicalgia: Secondary | ICD-10-CM | POA: Diagnosis not present

## 2022-05-15 DIAGNOSIS — Z9889 Other specified postprocedural states: Secondary | ICD-10-CM | POA: Insufficient documentation

## 2022-05-15 DIAGNOSIS — R29898 Other symptoms and signs involving the musculoskeletal system: Secondary | ICD-10-CM

## 2022-05-15 DIAGNOSIS — M5412 Radiculopathy, cervical region: Secondary | ICD-10-CM

## 2022-05-15 NOTE — Progress Notes (Signed)
   REFERRING PHYSICIAN:  Janeece Agee, Np [address] [place],  [place] [address]  DOS: 02/21/22 Cervical Disc Arthroplasty at C5-C6  HISTORY OF PRESENT ILLNESS: Molly Gibbs is status post Cervical Disc Arthroplasty at C5-C6.  She is doing extremely well.  PHYSICAL EXAMINATION:  NEUROLOGICAL:  General: In no acute distress.   Awake, alert, oriented to person, place, and time.  Pupils equal round and reactive to light.  Facial tone is symmetric.    Strength: Side Biceps Triceps Deltoid Interossei Grip Wrist Ext. Wrist Flex.  R 5 5 5 5 5 5 5   L 5 5 5 5 5 5 5    Incision c/d/I     Imaging:  No complications noted  Assessment / Plan: Molly Gibbs is doing well s/p above surgery.   We reviewed activity limitations.  She is now released to resume her regular activities.  I will see her back in 6 months with x-rays.     MD Dept of Neurosurgery

## 2022-05-16 ENCOUNTER — Encounter: Payer: BC Managed Care – PPO | Admitting: Internal Medicine

## 2022-05-18 ENCOUNTER — Encounter: Payer: BC Managed Care – PPO | Admitting: Family Medicine

## 2022-05-23 ENCOUNTER — Ambulatory Visit: Payer: BC Managed Care – PPO | Admitting: Internal Medicine

## 2022-06-05 DIAGNOSIS — R300 Dysuria: Secondary | ICD-10-CM | POA: Diagnosis not present

## 2022-06-11 ENCOUNTER — Ambulatory Visit: Payer: BC Managed Care – PPO | Admitting: Internal Medicine

## 2022-06-11 ENCOUNTER — Encounter: Payer: Self-pay | Admitting: Internal Medicine

## 2022-06-11 VITALS — BP 108/70 | HR 84 | Temp 98.2°F | Resp 18

## 2022-06-11 DIAGNOSIS — L2389 Allergic contact dermatitis due to other agents: Secondary | ICD-10-CM | POA: Diagnosis not present

## 2022-06-11 NOTE — Progress Notes (Signed)
    Follow-up Note  RE: Molly Gibbs MRN: [id number] DOB: 26-Apr-1991 Date of Office Visit: 06/11/2022  Primary care provider: Marrian Salvage, [place] Referring provider: Alliya, Marcon returns to the office today for the patch test placement, given suspected history of contact dermatitis.    Diagnostics: Metal patch testing placed as well as chlorhexidine Hibiclens.   Plan:   Allergic contact dermatitis - Instructions provided on care of the patches for the next 48 hours. - Ainslie was instructed to avoid showering for the next 48 hours. - [place] will follow up in 48 hours and 96 hours for patch readings.     Roney Marion, MD  [facility] [place] [place]

## 2022-06-13 ENCOUNTER — Ambulatory Visit: Payer: BC Managed Care – PPO | Admitting: Internal Medicine

## 2022-06-13 DIAGNOSIS — L2389 Allergic contact dermatitis due to other agents: Secondary | ICD-10-CM

## 2022-06-13 NOTE — Progress Notes (Signed)
   Follow Up Note  RE: AYLINE DINGUS MRN: [id number] DOB: 07-29-1990 Date of Office Visit: 06/13/2022  Referring provider: Marrian Salvage,* Primary care provider: Marrian Salvage, [place]  History of Present Illness: I had the pleasure of seeing Aaron Edelman for a follow up visit at the [facility] [place] on 06/13/2022. She is a 32 y.o. female, who is being followed for contact dermatitis. Today she is here for initial patch test interpretation, given suspected history of contact dermatitis.   Diagnostics:  TRUE TEST 48 hour reading:   Metals Patch - 06/13/22 1106     Time Antigen Placed 1100    Manufacturer Other    Location Back    Number of Test 12    Reading Interval Day 1;Day 3    Aluminum Hydroxide 10% 0    Chromium chloride 1% 0    Cobalt chloride hexahydrate 1% 0    Molybdenum chloride 0.5% 0    Nickel sulfate hexahydrate 5% 0    Potassium dichromate 0.25% 0    Copper sulfate pentahydrate 2% 0    Tantal 1% 0    Titanium 0.1% 0    Manganese chloride 0.5% 0    Vanadium Pentoxide 10% 0    Other 0   chlorhexidine 0.12%   Comments chlorhexididine 0.12%              Assessment and Plan: Ermine is a 32 y.o. female with: Concern for Contact Dermatitis:  The patient has been provided detailed information regarding the substances she is sensitive to, as well as products containing the substances.  Meticulous avoidance of these substances is recommended. If avoidance is not possible, the use of barrier creams or lotions is recommended. If symptoms persist or progress despite meticulous avoidance of chemicals/substances above, dermatology evaluation may be warranted. Patch testing read today was negative.  Please avoid getting your back wet.   I want you to come back Friday for a read and then a delayed final read on Monday since metals can sometimes react later than normal.    No follow-ups on file.  It was my pleasure to see [place] today  and participate in her care. Please feel free to contact me with any questions or concerns.  Sincerely,   Harlon Flor, MD [facility]

## 2022-06-13 NOTE — Patient Instructions (Addendum)
Patch testing read today was negative.  Please avoid getting your back wet.   I want you to come back Friday for a read and then a delayed final read on Monday since metals can sometimes react later than normal.

## 2022-06-15 ENCOUNTER — Encounter: Payer: Self-pay | Admitting: Family

## 2022-06-15 ENCOUNTER — Ambulatory Visit: Payer: BC Managed Care – PPO | Admitting: Family

## 2022-06-15 VITALS — Wt 185.3 lb

## 2022-06-15 DIAGNOSIS — L2389 Allergic contact dermatitis due to other agents: Secondary | ICD-10-CM

## 2022-06-15 NOTE — Progress Notes (Signed)
Molly Gibbs returns to the office today for the final patch test interpretation, given suspected history of contact dermatitis.   She reports that she started having a headache with nausea on Monday. She has a history of migraines, but it has been awhile since she has had one. She did not take any medication because she did not know if she could take anything. Instructed Molly Gibbs that the only medication that blocks this test is steroids.  Diagnostics:   Metal patch and chlorhexidine 0.12 % 96-hour hour reading: all negative                       Aluminum Hydroxide 10% 0     Chromium chloride 1% 0     Cobalt chloride hexahydrate 1% 0     Molybdenum chloride 0.5% 0     Nickel sulfate hexahydrate 5% 0     Potassium dichromate 0.25% 0     Copper sulfate pentahydrate 2% 0     Tantal 1% 0     Titanium 0.1% 0     Manganese chloride 0.5% 0     Vanadium Pentoxide 10% 0     Other 0   chlorhexidine 0.12%    Plan:   Allergic contact dermatitis - keep 1 week follow up reading on 06/18/21  Molly Gibbs, [place] [facility] [place]

## 2022-06-18 ENCOUNTER — Ambulatory Visit: Payer: BC Managed Care – PPO | Admitting: Internal Medicine

## 2022-06-18 DIAGNOSIS — L2389 Allergic contact dermatitis due to other agents: Secondary | ICD-10-CM

## 2022-06-18 NOTE — Progress Notes (Signed)
   Follow Up Note  RE: Molly Gibbs MRN: [id number] DOB: 1990-09-11 Date of Office Visit: 06/18/2022  Referring provider: Marrian Salvage,* Primary care provider: Marrian Salvage, [place]  History of Present Illness: I had the pleasure of seeing Molly Gibbs for a follow up visit at the [facility] [place] [place] on 06/18/2022. She is a 32 y.o. female, who is being followed for contact dermatitis . Today she is here for final patch test interpretation, given suspected history of contact dermatitis.   Diagnostics:  Metal patch and Chlorhexidine  7 day  reading: negative   Metals Patch - 06/18/22 1000     Time Antigen Placed 0930    Manufacturer Other    Location Back    Number of Test 12    Reading Interval Day 8    Aluminum Hydroxide 10% 0    Chromium chloride 1% 0    Cobalt chloride hexahydrate 1% 0    Molybdenum chloride 0.5% 0    Nickel sulfate hexahydrate 5% 0    Potassium dichromate 0.25% 0    Copper sulfate pentahydrate 2% 0    Tantal 1% 0    Titanium 0.1% 0    Manganese chloride 0.5% 0    Vanadium Pentoxide 10% 0    Other 0   Chlorhexidine 0.12%             Assessment and Plan: Molly Gibbs is a 32 y.o. female with: Concern for Contact Dermatitis:  Recommend avoidance of chloraprep for future surgeries to be safe.   The patient has been provided detailed information regarding the substances she is sensitive to, as well as products containing the substances.  Meticulous avoidance of these substances is recommended. If avoidance is not possible, the use of barrier creams or lotions is recommended. If symptoms persist or progress despite meticulous avoidance of chemicals/substances above, dermatology evaluation may be warranted. No follow-ups on file.  It was my pleasure to see Molly Gibbs today and participate in her care. Please feel free to contact me with any questions or concerns.  Sincerely,   Roney Marion, MD [facility]  [place]

## 2022-06-19 NOTE — Addendum Note (Signed)
Addended by: Felipa Emory on: 06/19/2022 08:55 AM   Modules accepted: Orders

## 2022-11-12 ENCOUNTER — Other Ambulatory Visit: Payer: Self-pay

## 2022-11-12 ENCOUNTER — Ambulatory Visit
Admission: RE | Admit: 2022-11-12 | Discharge: 2022-11-12 | Disposition: A | Discharge status: Home / Self Care | Payer: BC Managed Care – PPO | Source: Ambulatory Visit | Attending: Neurosurgery | Admitting: Neurosurgery

## 2022-11-12 ENCOUNTER — Ambulatory Visit
Admission: RE | Admit: 2022-11-12 | Discharge: 2022-11-12 | Disposition: A | Discharge status: Home / Self Care | Payer: BC Managed Care – PPO | Attending: Neurosurgery | Admitting: Neurosurgery

## 2022-11-12 DIAGNOSIS — M5412 Radiculopathy, cervical region: Secondary | ICD-10-CM

## 2022-11-12 DIAGNOSIS — M4320 Fusion of spine, site unspecified: Secondary | ICD-10-CM | POA: Diagnosis not present

## 2022-11-13 ENCOUNTER — Ambulatory Visit: Payer: BC Managed Care – PPO | Admitting: Neurosurgery

## 2022-11-13 ENCOUNTER — Encounter: Payer: Self-pay | Admitting: Neurosurgery

## 2022-11-13 VITALS — BP 124/80 | Ht 63.0 in | Wt 186.0 lb

## 2022-11-13 DIAGNOSIS — M5412 Radiculopathy, cervical region: Secondary | ICD-10-CM | POA: Diagnosis not present

## 2022-11-13 DIAGNOSIS — Z09 Encounter for follow-up examination after completed treatment for conditions other than malignant neoplasm: Secondary | ICD-10-CM | POA: Diagnosis not present

## 2022-11-13 NOTE — Progress Notes (Signed)
   REFERRING PHYSICIAN:  No referring provider defined for this encounter.  DOS: 02/21/22 Cervical Disc Arthroplasty at C5-C6  HISTORY OF PRESENT ILLNESS: Molly Gibbs is status post Cervical Disc Arthroplasty at C5-C6.  She is doing extremely well.  PHYSICAL EXAMINATION:  NEUROLOGICAL:  General: In no acute distress.   Awake, alert, oriented to person, place, and time.  Pupils equal round and reactive to light.  Facial tone is symmetric.    Strength: Side Biceps Triceps Deltoid Interossei Grip Wrist Ext. Wrist Flex.  R 5 5 5 5 5 5 5   L 5 5 5 5 5 5 5    Incision c/d/I     Imaging:  No complications noted  Assessment / Plan: Molly Gibbs is doing well s/p above surgery.  She has occasional discomfort with activity.  She uses ibuprofen and Tylenol for this.  Her incision is still somewhat pink.  I would expect it to have faded back towards her normal skin tone at this point.  We discussed referral to plastic surgery for evaluation of her scar, but she would like to think about it.  I will see her back on an as-needed basis.   Venetia Night MD Dept of Neurosurgery

## 2022-11-15 IMAGING — US US PELVIS COMPLETE WITH TRANSVAGINAL
1 series · 14 of 25 positions shown · non-contrast
Comparison: Prior CT from 06/21/2020

CLINICAL DATA: Initial evaluation for ovarian cyst/mass seen on
recent CT. Pelvic pain.



[Series 1: us pelvis complete with transvaginal · 0.26mm/px · 14 of 66 slices shown]
[im 1/66]
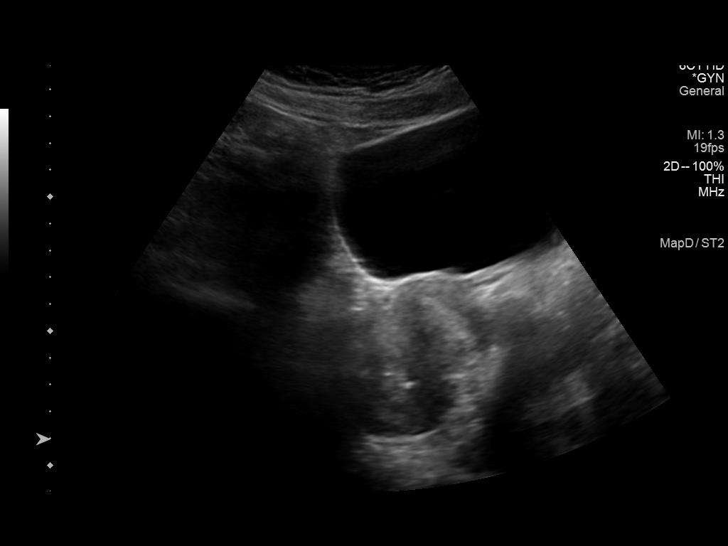
[im 6/66]
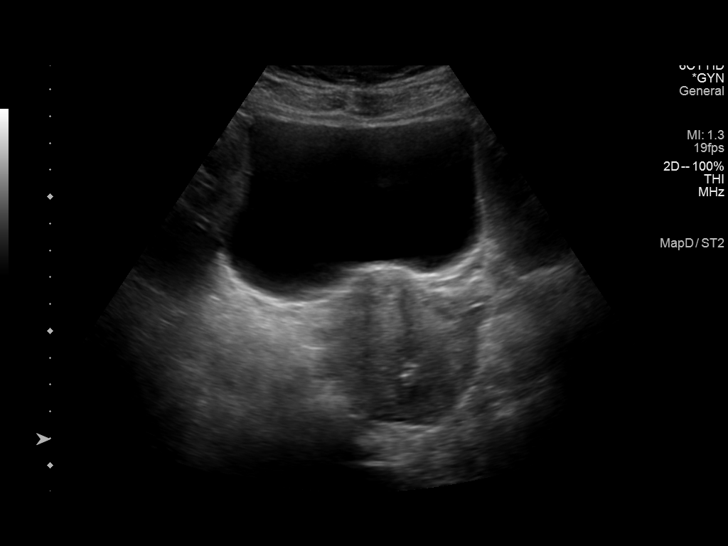
[im 11/66]
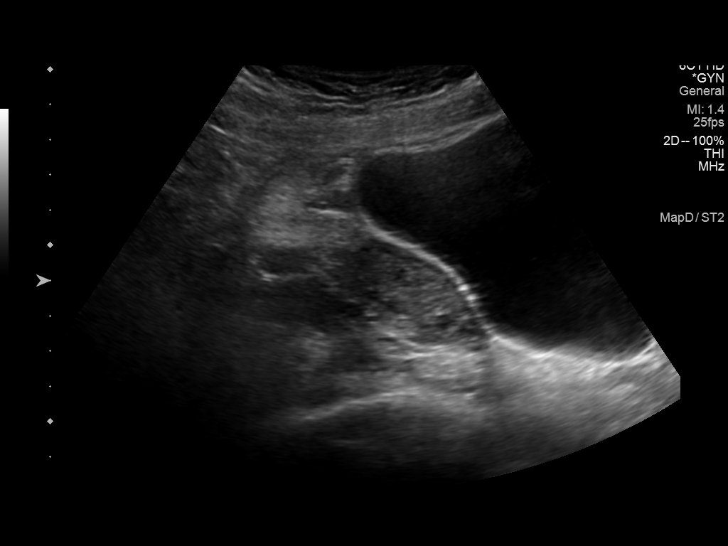
[im 17/66]
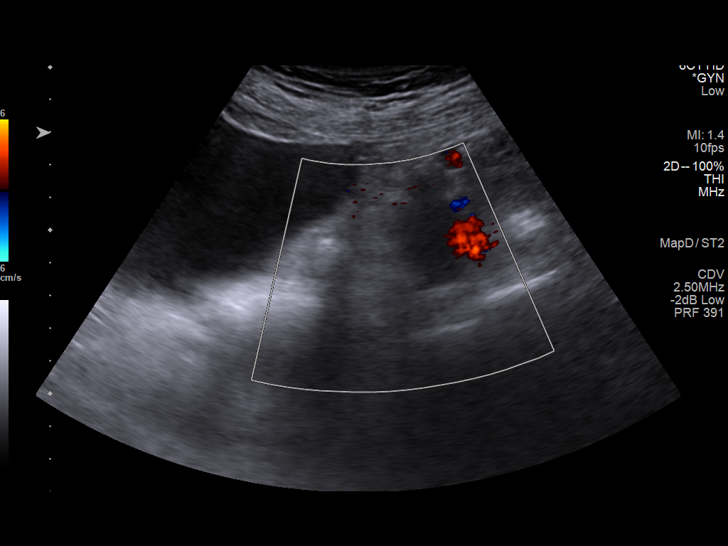
[im 22/66]
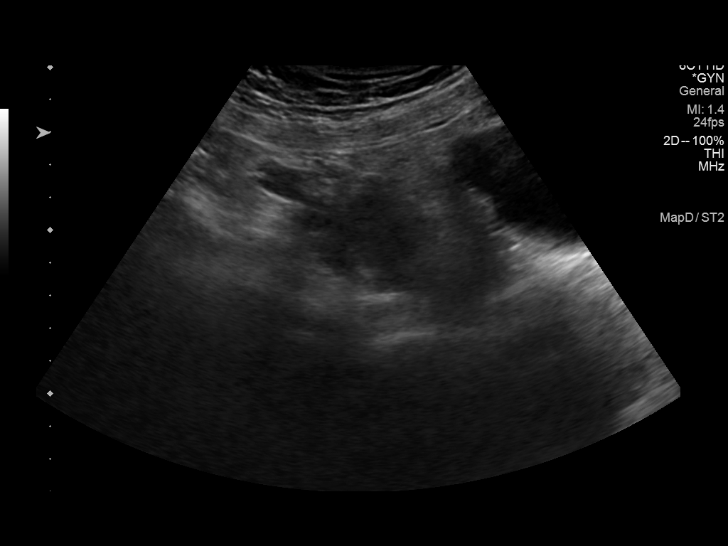
[im 25/66]
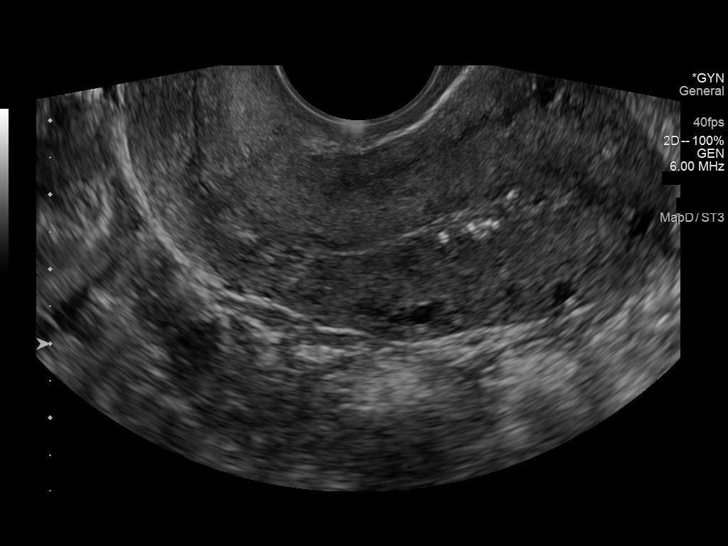
[im 30/66]
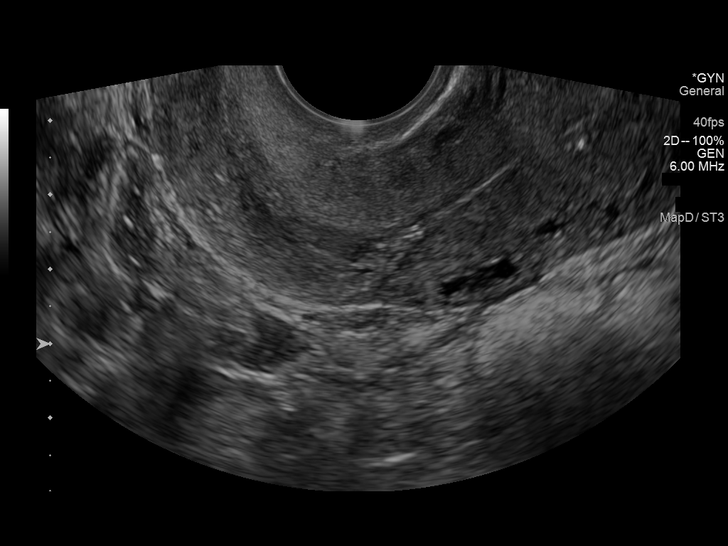
[im 36/66]
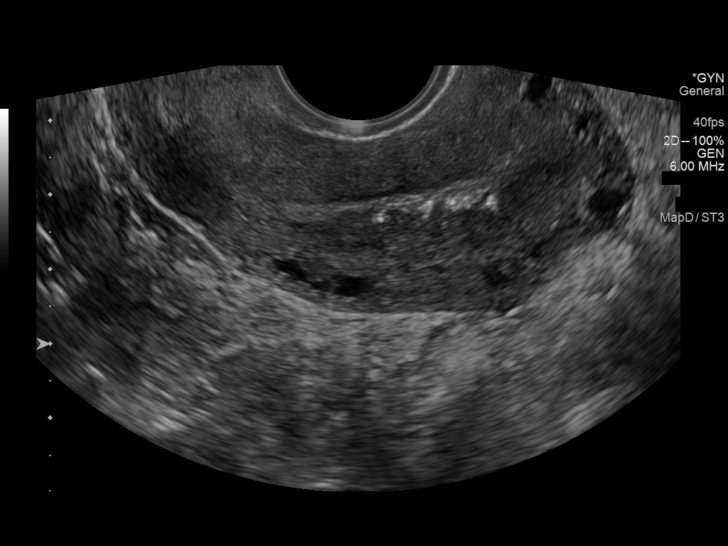
[im 41/66]
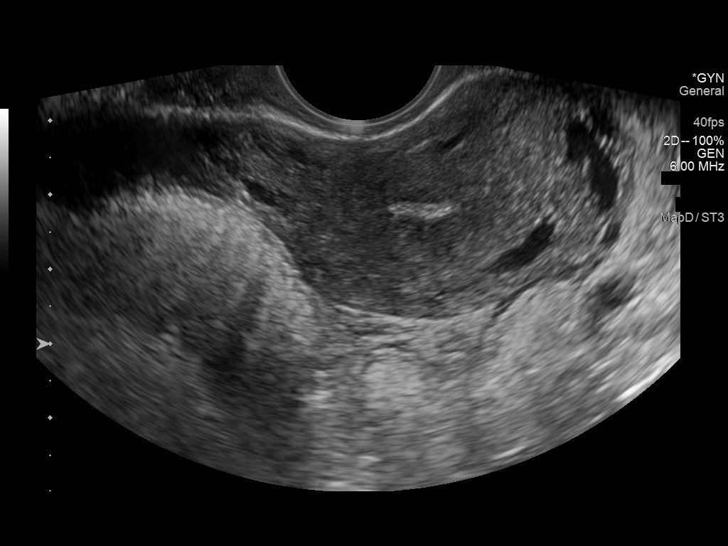
[im 44/66]
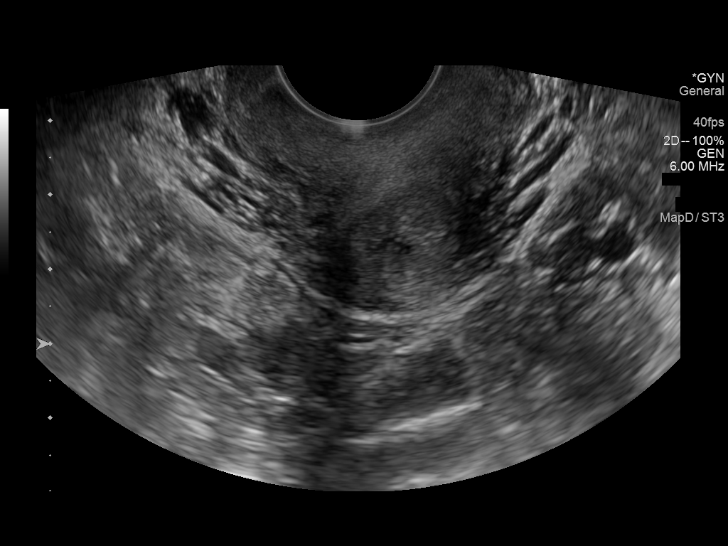
[im 49/66]
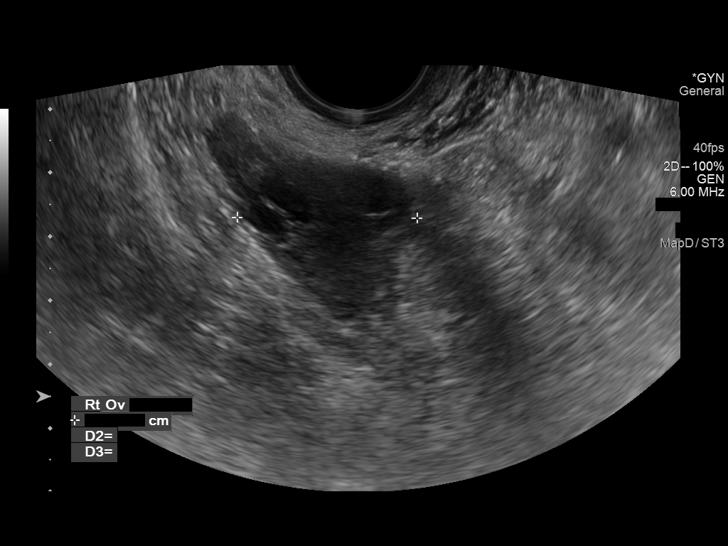
[im 55/66]
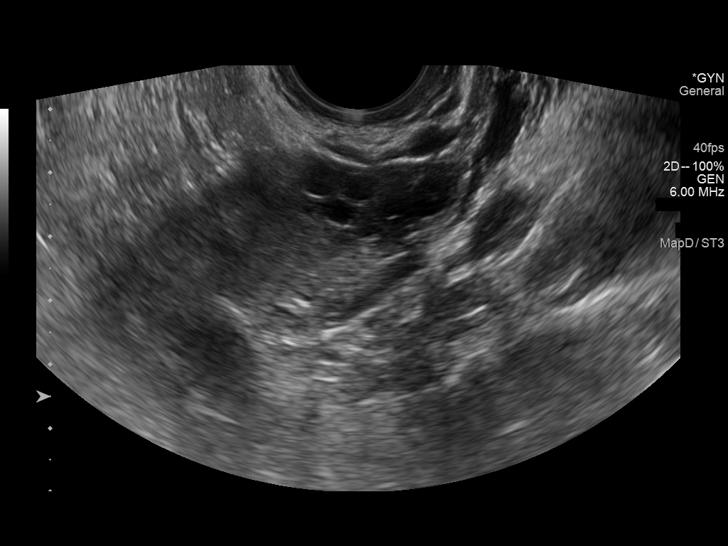
[im 60/66]
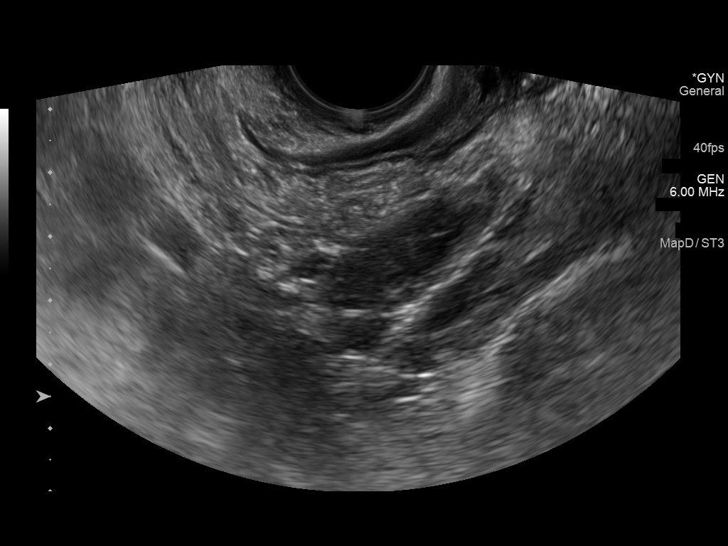
[im 66/66]
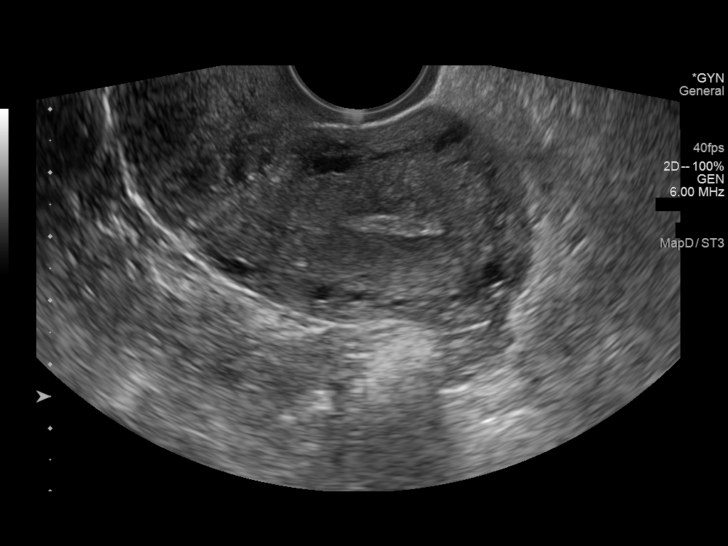

[14 of 25 positions shown; findings below may reference images not displayed]

FINDINGS: Uterus

Measurements: 7.7 x 3.5 x 4.9 cm = volume: 70 mL. No fibroids or
other mass visualized.

Endometrium

Thickness: 3.1 mm.  No focal abnormality visualized.

Right ovary

Measurements: 3.9 x 2.3 x 2.8 cm = volume: 13.1 mL. Normal
appearance/no adnexal mass.

Left ovary

Measurements: 3.0 x 1.3 x 3.5 cm = volume: 7.5 mL. Normal
appearance/no adnexal mass.

Other findings

No abnormal free fluid.
IMPRESSION: Normal pelvic ultrasound. Previously seen left ovarian cyst has
resolved. No pelvic or adnexal mass seen on today's exam.

## 2023-03-08 ENCOUNTER — Ambulatory Visit: Payer: BC Managed Care – PPO | Admitting: Family

## 2023-03-08 VITALS — BP 110/72 | HR 82 | Resp 18 | Ht 63.0 in | Wt 186.0 lb

## 2023-03-08 DIAGNOSIS — F419 Anxiety disorder, unspecified: Secondary | ICD-10-CM

## 2023-03-08 DIAGNOSIS — T148XXA Other injury of unspecified body region, initial encounter: Secondary | ICD-10-CM

## 2023-03-08 MED ORDER — DULOXETINE HCL 30 MG PO CPEP: 30.0000 mg | ORAL_CAPSULE | Freq: Every day | ORAL | 1 refills | Status: DC

## 2023-03-08 NOTE — Progress Notes (Signed)
Molly Gibbs is a 32 y.o. female with the following history as recorded in EpicCare:  Patient Active Problem List   Diagnosis Date Noted   Cervical radiculopathy    Left arm weakness    Atypical chest pain 07/27/2019   Mixed hyperlipidemia 07/27/2019   Tobacco use 04/09/2019   Dyslipidemia (high LDL; low HDL) 04/09/2019   Obesity (BMI 30-39.9) 04/09/2019   Chest pain of uncertain etiology 04/09/2019   Palpitations 04/09/2019   Knee pain 06/27/2012   Asthma 06/27/2011    Current Outpatient Medications  Medication Sig Dispense Refill   DULoxetine (CYMBALTA) 30 MG capsule Take 1 capsule (30 mg total) by mouth daily. 30 capsule 1   ibuprofen (ADVIL) 800 MG tablet Take 1 tablet (800 mg total) by mouth with breakfast, with lunch, and with evening meal. 90 tablet 0   No current facility-administered medications for this visit.    Allergies: Codeine, Other, and Imitrex [sumatriptan]  Past Medical History:  Diagnosis Date   Anemia    Anxiety    Asthma    asthmatic bronchitis no inhaler   Blood transfusion without reported diagnosis    Cervical radiculopathy    Chest pain of uncertain etiology    Complication of anesthesia    Depression    GERD (gastroesophageal reflux disease)    Hyperlipidemia    Obesity    PONV (postoperative nausea and vomiting)    with c section only   Tobacco use     Past Surgical History:  Procedure Laterality Date   CERVICAL DISC ARTHROPLASTY N/A 02/21/2022   Procedure: C5-6 ARTHROPLASTY;  Surgeon: Venetia Night, MD;  Location: [facility] ORS;  Service: Neurosurgery;  Laterality: N/A;   CESAREAN SECTION     x1   DILATION AND EVACUATION N/A 10/29/2012   Procedure: DILATATION AND EVACUATION WITH ULTRASOUND;  Surgeon: Alphonsus Sias. Ernestina Penna, MD;  Location: [facility] ORS;  Service: Gynecology;  Laterality: N/A;  CHROMOSOME STUDIES;POSSIBLE MOLAR PREGNANCY.   Bladder instillation performed    Family History  Problem Relation Age of Onset   Arthritis Maternal  Grandmother        Rheumatoid arthritis   Breast cancer Maternal Grandmother        breast   Heart disease Maternal Grandmother    Arthritis Maternal Grandfather    Arthritis Paternal Grandmother        Osteoarthritis   Heart disease Paternal Grandmother    Atrial fibrillation Paternal Grandmother    Arthritis Paternal Grandfather        Osteoarthritis   Lung cancer Paternal Grandfather        lung    Social History   Tobacco Use   Smoking status: Former    Current packs/day: 0.00    Average packs/day: 1 pack/day for 9.0 years (9.0 ttl pk-yrs)    Types: Cigarettes    Start date: 02/08/2013    Quit date: 02/08/2022    Years since quitting: 1.0   Smokeless tobacco: Never  Substance Use Topics   Alcohol use: Not Currently    Subjective:   Follow up to discuss anxiety/ re-starting medication; has been prescribed Prozac 20 mg in the past- last prescription was written in November 2023; has been working with therapist every 2 weeks;  Is also struggling 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  Head: Normocephalic and atraumatic  Lungs: Respirations unlabored; clear to auscultation bilaterally without wheeze, rales, rhonchi  CVS exam: normal rate and regular rhythm.  Neurologic: Alert and oriented; speech intact; face symmetrical; moves all extremities well; CNII-XII intact without focal deficit   Assessment:  1. Anxiety   2. Nerve damage     Plan:  Discussed trial of Cymbalta 30 mg and risks/ benefits for anxiety and neuropathy; patient is comfortable with this medication; patient will call back with response in 2-3 weeks;   Time spent 30 minutes  No follow-ups on  file.  No orders of the defined types were placed in this encounter.   Requested Prescriptions   Signed Prescriptions Disp Refills   DULoxetine (CYMBALTA) 30 MG capsule 30 capsule 1    Sig: Take 1 capsule (30 mg total) by mouth daily.

## 2023-03-20 ENCOUNTER — Encounter: Payer: Self-pay | Admitting: Family

## 2023-04-17 ENCOUNTER — Other Ambulatory Visit: Payer: Self-pay | Admitting: Family

## 2023-04-17 MED ORDER — DULOXETINE HCL 60 MG PO CPEP: 60.0000 mg | ORAL_CAPSULE | Freq: Every day | ORAL | 1 refills | Status: DC

## 2023-04-30 ENCOUNTER — Other Ambulatory Visit: Payer: Self-pay | Admitting: Family

## 2023-06-28 ENCOUNTER — Telehealth: Payer: BC Managed Care – PPO | Admitting: Family Medicine

## 2023-06-28 DIAGNOSIS — J111 Influenza due to unidentified influenza virus with other respiratory manifestations: Secondary | ICD-10-CM

## 2023-06-28 MED ORDER — OSELTAMIVIR PHOSPHATE 75 MG PO CAPS: 75.0000 mg | ORAL_CAPSULE | Freq: Two times a day (BID) | ORAL | 0 refills | Status: AC

## 2023-06-28 MED ORDER — BENZONATATE 200 MG PO CAPS: 200.0000 mg | ORAL_CAPSULE | Freq: Two times a day (BID) | ORAL | 0 refills | Status: DC | PRN

## 2023-06-28 NOTE — Patient Instructions (Signed)

## 2023-06-28 NOTE — Progress Notes (Signed)
Virtual Visit Consent   Molly Gibbs, you are scheduled for a virtual visit with a [facility] provider today. Just as with appointments in the office, your consent must be obtained to participate. Your consent will be active for this visit and any virtual visit you may have with one of our providers in the next 365 days. If you have a MyChart account, a copy of this consent can be sent to you electronically.  As this is a virtual visit, video technology does not allow for your provider to perform a traditional examination. This may limit your provider's ability to fully assess your condition. If your provider identifies any concerns that need to be evaluated in person or the need to arrange testing (such as labs, EKG, etc.), we will make arrangements to do so. Although advances in technology are sophisticated, we cannot ensure that it will always work on either your end or our end. If the connection with a video visit is poor, the visit may have to be switched to a telephone visit. With either a video or telephone visit, we are not always able to ensure that we have a secure connection.  By engaging in this virtual visit, you consent to the provision of healthcare and authorize for your insurance to be billed (if applicable) for the services provided during this visit. Depending on your insurance coverage, you may receive a charge related to this service.  I need to obtain your verbal consent now. Are you willing to proceed with your visit today? Molly Gibbs has provided verbal consent on 06/28/2023 for a virtual visit (video or telephone). Molly Curio, FNP  Date: 06/28/2023 4:06 PM  Virtual Visit via Video Note   I, Molly Gibbs, connected with  Molly Gibbs  ([id number], Jun 22, 1990) on 06/28/23 at  4:15 PM EST by a video-enabled telemedicine application and verified that I am speaking with the correct person using two identifiers.  Location: Patient: Virtual Visit Location Patient:  Home Provider: Virtual Visit Location Provider: Home Office   I discussed the limitations of evaluation and management by telemedicine and the availability of in person appointments. The patient expressed understanding and agreed to proceed.    History of Present Illness: Molly Gibbs is a 33 y.o. who identifies as a female who was assigned female at birth, and is being seen today for exposure to influenza with just beginning of sore throat, cough, chills, aches starting today. Daughter has the flu. Marland Kitchen  HPI: HPI  Problems:  Patient Active Problem List   Diagnosis Date Noted   Cervical radiculopathy    Left arm weakness    Atypical chest pain 07/27/2019   Mixed hyperlipidemia 07/27/2019   Tobacco use 04/09/2019   Dyslipidemia (high LDL; low HDL) 04/09/2019   Obesity (BMI 30-39.9) 04/09/2019   Chest pain of uncertain etiology 04/09/2019   Palpitations 04/09/2019   Knee pain 06/27/2012   Asthma 06/27/2011    Allergies:  Allergies  Allergen Reactions   Codeine Swelling    swelling   Other Anaphylaxis and Swelling    Peppermint Oil   Imitrex [Sumatriptan] Swelling    tongue   Medications:  Current Outpatient Medications:    benzonatate (TESSALON) 200 MG capsule, Take 1 capsule (200 mg total) by mouth 2 (two) times daily as needed for cough., Disp: 20 capsule, Rfl: 0   oseltamivir (TAMIFLU) 75 MG capsule, Take 1 capsule (75 mg total) by mouth 2 (two) times daily for 5 days., Disp: 10 capsule,  Rfl: 0   DULoxetine (CYMBALTA) 60 MG capsule, Take 1 capsule (60 mg total) by mouth daily., Disp: 90 capsule, Rfl: 1   ibuprofen (ADVIL) 800 MG tablet, Take 1 tablet (800 mg total) by mouth with breakfast, with lunch, and with evening meal., Disp: 90 tablet, Rfl: 0  Observations/Objective: Patient is well-developed, well-nourished in no acute distress.  Resting comfortably  at home.  Head is normocephalic, atraumatic.  No labored breathing.  Speech is clear and coherent with logical  content.  Patient is alert and oriented at baseline.    Assessment and Plan: 1. Influenza (Primary)  Increase fluid, humidifier at night, tylenol or ibuprofen as directed, UC if sx worsen.   Follow Up Instructions: I discussed the assessment and treatment plan with the patient. The patient was provided an opportunity to ask questions and all were answered. The patient agreed with the plan and demonstrated an understanding of the instructions.  A copy of instructions were sent to the patient via MyChart unless otherwise noted below.   Patient has requested to receive PHI (AVS, Work Notes, etc) pertaining to this video visit through e-mail as they are currently without active MyChart. They have voiced understand that email is not considered secure and their health information could be viewed by someone other than the patient.   The patient was advised to call back or seek an in-person evaluation if the symptoms worsen or if the condition fails to improve as anticipated.    Molly Curio, FNP

## 2023-07-17 DIAGNOSIS — Z124 Encounter for screening for malignant neoplasm of cervix: Secondary | ICD-10-CM | POA: Diagnosis not present

## 2023-07-17 DIAGNOSIS — Z01419 Encounter for gynecological examination (general) (routine) without abnormal findings: Secondary | ICD-10-CM | POA: Diagnosis not present

## 2023-07-17 DIAGNOSIS — Z1331 Encounter for screening for depression: Secondary | ICD-10-CM | POA: Diagnosis not present

## 2023-08-16 DIAGNOSIS — N926 Irregular menstruation, unspecified: Secondary | ICD-10-CM | POA: Diagnosis not present

## 2023-09-16 IMAGING — US US BREAST*L* LIMITED INC AXILLA
1 series · 6 of 6 positions shown · non-contrast
Comparison: Previous exam(s).

CLINICAL DATA: [DATE] month follow-up for probably benign mass in
the LEFT breast. Patient reports the mass is interval minimally
tender and otherwise unchanged.

EXAM:
ULTRASOUND OF THE LEFT BREAST

[Series 1: us breast*left* limited inc axilla · 0.06mm/px · 6 of 6 slices shown]
[im 1/6]
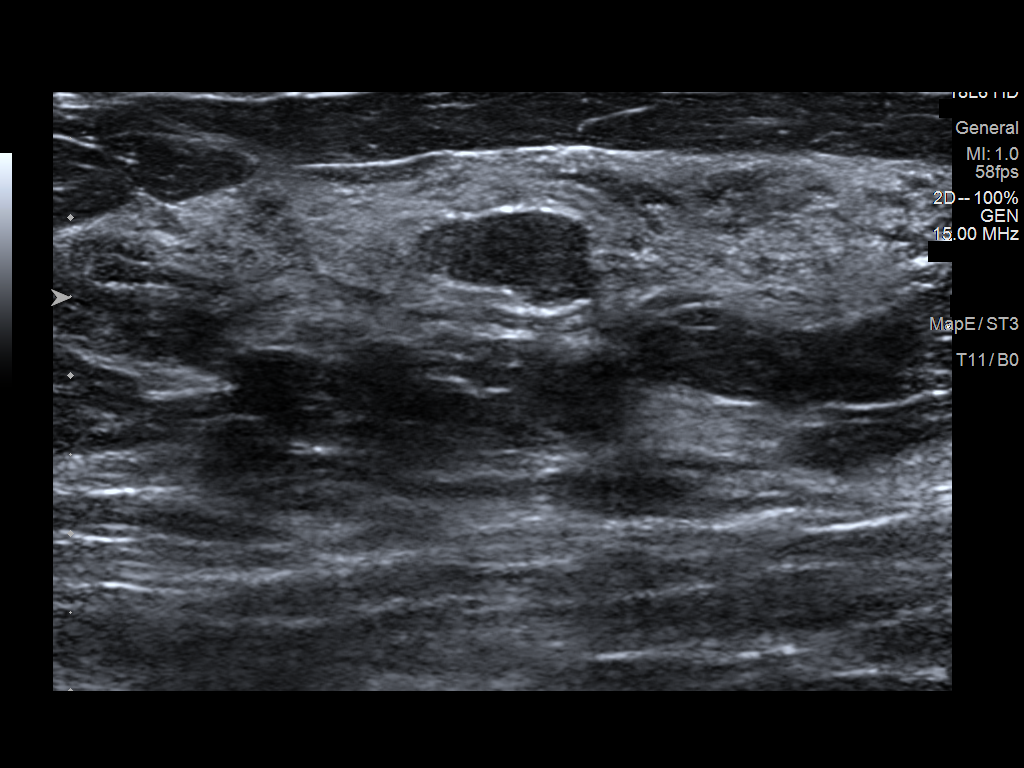
[im 2/6]
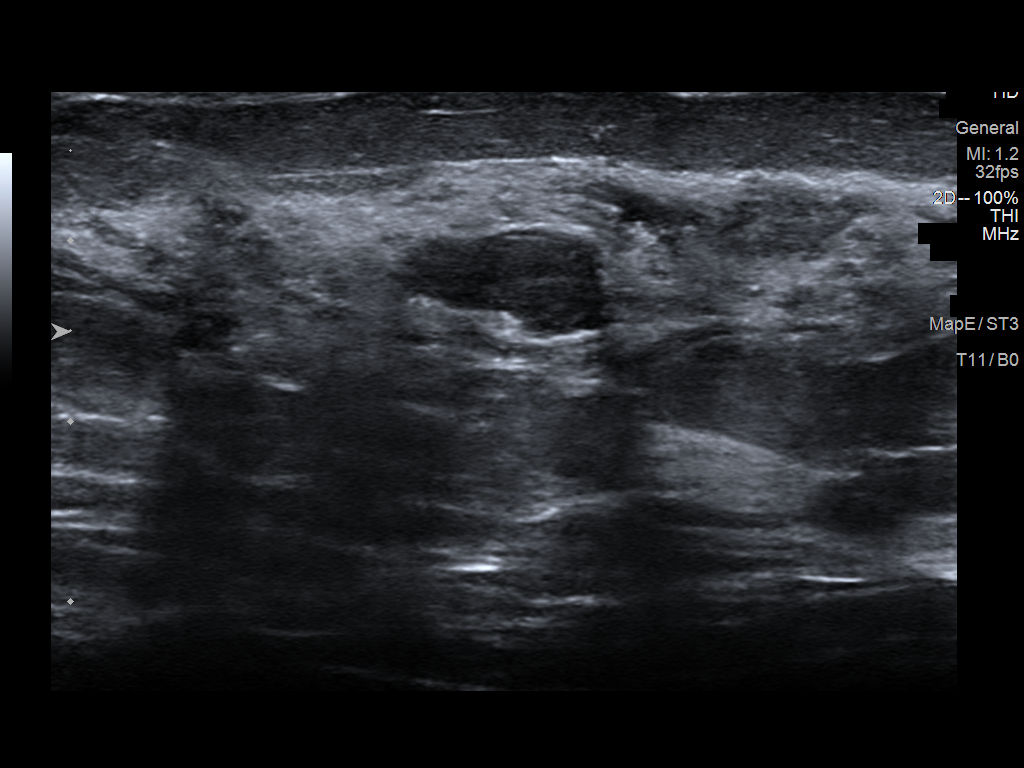
[im 3/6]
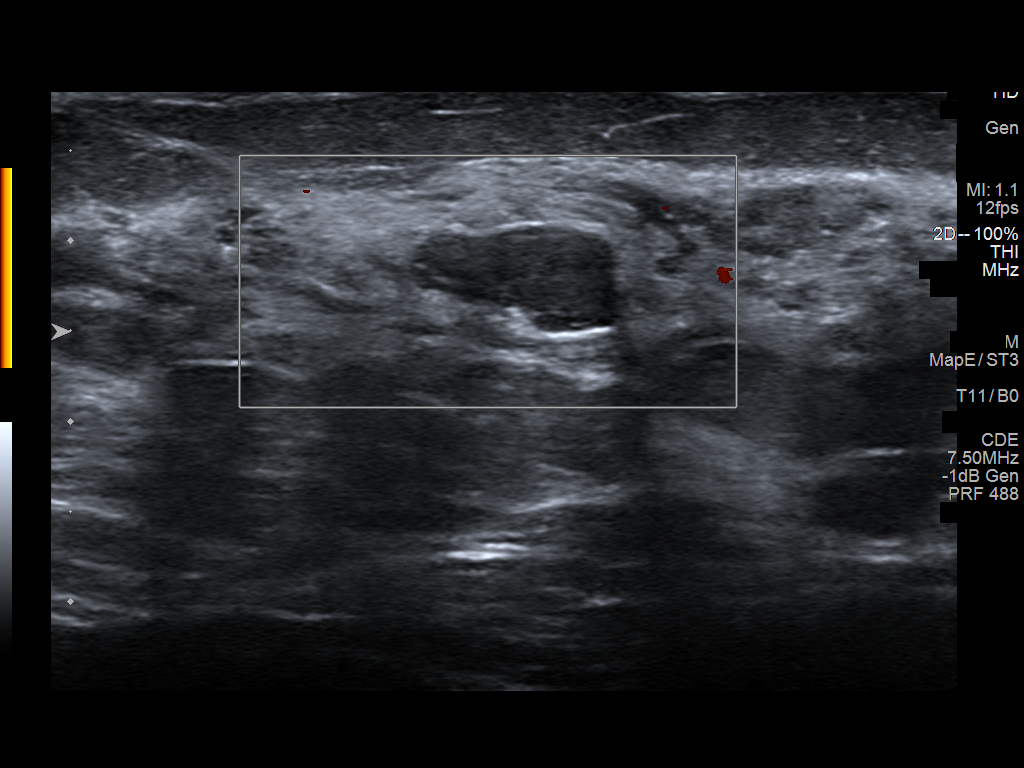
[im 4/6]
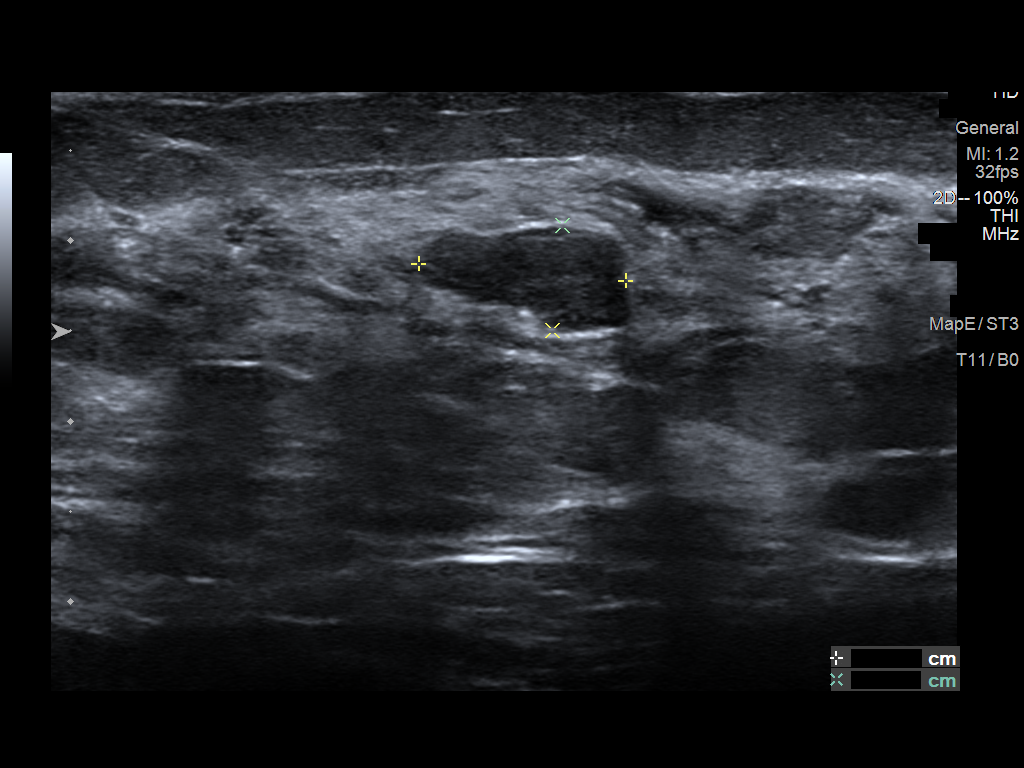
[im 5/6]
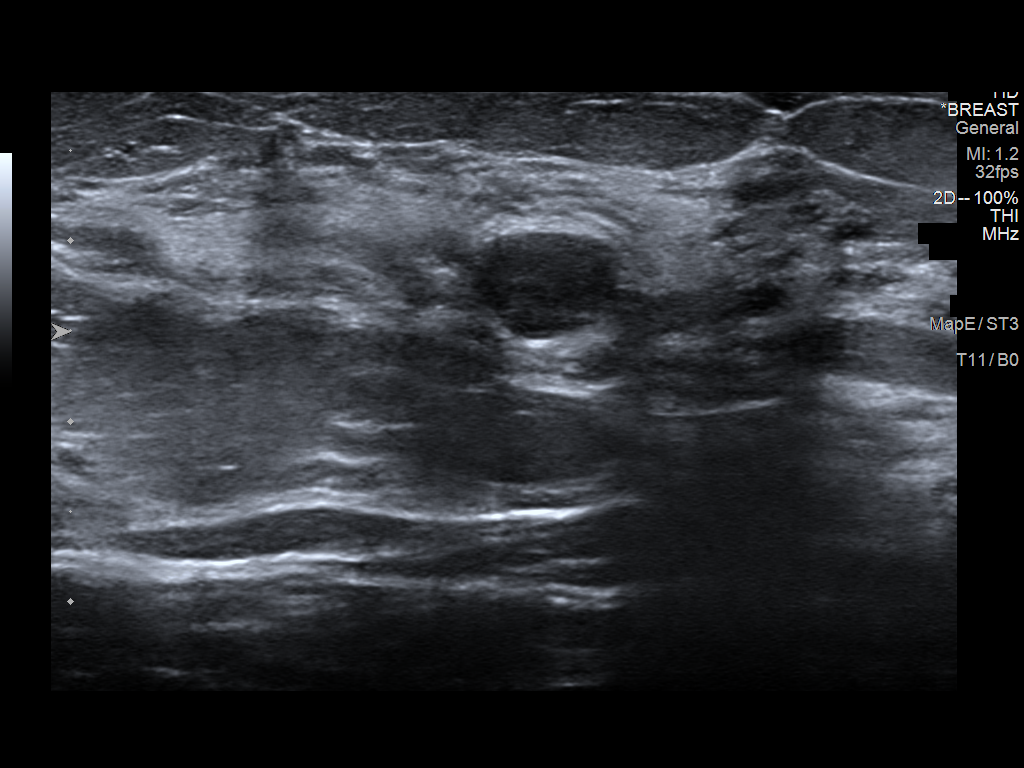
[im 6/6]
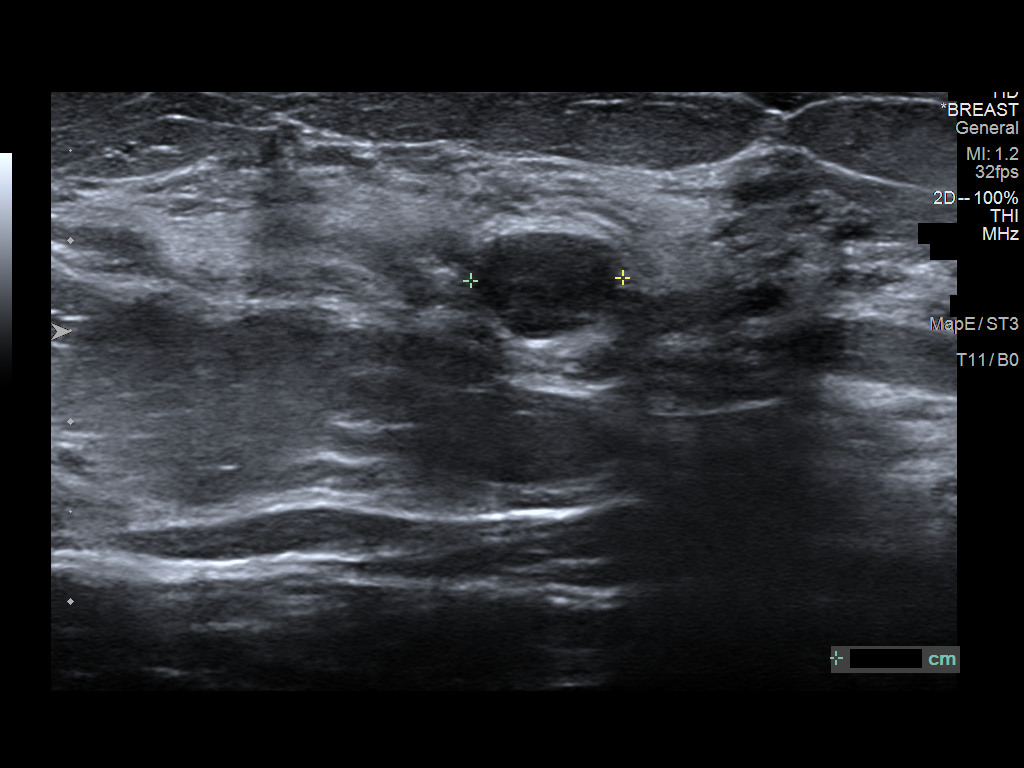

[6 of 6 positions shown; findings below may reference images not displayed]

FINDINGS: Targeted ultrasound is performed, showing a circumscribed oval
hypoechoic parallel mass with posterior acoustic enhancement in the
1:30 o'clock location of the LEFT breast 3 centimeters from the
nipple. Mass is 0.8 x 1.2 x 0.6 centimeters. The appearance is
stable compared with prior exams.
IMPRESSION: Stable probable fibroadenoma in the LEFT breast.

RECOMMENDATION:
Recommend 1 additional follow-up in 1 year.

I have discussed the findings and recommendations with the patient.
If applicable, a reminder letter will be sent to the patient
regarding the next appointment.

BI-RADS CATEGORY  3: Probably benign.

## 2023-10-02 ENCOUNTER — Other Ambulatory Visit: Payer: Self-pay | Admitting: Family

## 2023-12-02 ENCOUNTER — Other Ambulatory Visit: Payer: Self-pay | Admitting: Family

## 2023-12-12 ENCOUNTER — Other Ambulatory Visit: Payer: Self-pay | Admitting: Family

## 2023-12-12 NOTE — Telephone Encounter (Signed)
 Copied from CRM [id number]. Topic: Clinical - Medication Refill >> Dec 12, 2023 11:41 AM Tanazia G wrote: Medication:  DULoxetine  (CYMBALTA ) 60 MG capsule  Has the patient contacted their pharmacy? Yes (Agent: If no, request that the patient contact the pharmacy for the refill. If patient does not wish to contact the pharmacy document the reason why and proceed with request.) (Agent: If yes, when and what did the pharmacy advise?)  This is the patient's preferred pharmacy:  [id number] - [place], [place] - [address] [place] [address] Phone: [phone] Fax: [phone]  Is this the correct pharmacy for this prescription? Yes If no, delete pharmacy and type the correct one.   Has the prescription been filled recently? Yes  Is the patient out of the medication? Yes  Has the patient been seen for an appointment in the last year OR does the patient have an upcoming appointment? Yes  Can we respond through MyChart? Yes  Agent: Please be advised that Rx refills may take up to 3 business days. We ask that you follow-up with your pharmacy.

## 2023-12-16 ENCOUNTER — Ambulatory Visit: Payer: Self-pay

## 2023-12-16 NOTE — Telephone Encounter (Signed)
 FYI Only or Action Required?: Action required by provider: request for appointment.  Patient was last seen in primary care on 06/28/2023 by Blair, Diane W, FNP.  Called Nurse Triage reporting Delirium Tremens (DTS).  Symptoms began several days ago.  Interventions attempted: Nothing.  Symptoms are: gradually worsening.  Triage Disposition: See PCP Within 2 Weeks  Patient/caregiver understands and will follow disposition?: UnsureCopied from CRM [id number]. Topic: Clinical - Red Word Triage >> Dec 16, 2023  8:02 AM Henretta I wrote: Red Word that prompted transfer to Nurse Triage: Patient is experiencing withdrawal symptoms from DULoxetine  (CYMBALTA ) 60 MG capsule. Brain fog, profusely sweating, fever, and extreme nausea Reason for Disposition  [1] Longstanding confusion (e.g., dementia, stroke) AND [2] NO worsening or change  Answer Assessment - Initial Assessment Questions 1. LEVEL OF CONSCIOUSNESS: How are they (the patient) acting right now? (e.g., alert-oriented, confused, lethargic, stuporous, comatose)     Alert and oriented 2. ONSET: When did the confusion start?  (e.g., minutes, hours, days)     yesterday 3. PATTERN: Does this come and go, or has it been constant since it started?  Is it present now?     Constant  4. ALCOHOL or DRUGS: Have they been drinking alcohol or taking any drugs?      Na  5. NARCOTIC MEDICINES: Have they been receiving any narcotic medications? (e.g., morphine, Vicodin)     Na  6. CAUSE: What do you think is causing the confusion?      Cymbalta  withdrawal 7. OTHER SYMPTOMS: Are there any other symptoms? (e.g., difficulty breathing, fever, headache, weakness)     Sweating, nausea, fever     Pt calling with Cymbalta  withdrawals. Pt stated her refilled was denied and pt found out she has to be seen by provider for labs/evaluation. Pt look last tablet on Thursday night. Pt started with headache Friday, Saturday was sweating/fever,and  yesterday nausea, brain fog, and fatigue.  No PCP appt unitl 7/31. CAL notified and will reach out to pt after reviewing call.  Protocols used: Confusion - Delirium-A-AH

## 2023-12-20 MED ORDER — DULOXETINE HCL 60 MG PO CPEP: 60.0000 mg | ORAL_CAPSULE | Freq: Every day | ORAL | 0 refills | Status: DC

## 2023-12-20 NOTE — Addendum Note (Signed)
 Addended by: Jak Haggar M on: 12/20/2023 04:50 PM   Modules accepted: Orders

## 2023-12-20 NOTE — Telephone Encounter (Signed)
 30 day supply sent in for pt, pt has follow up with PCP 12/24/2023.

## 2023-12-20 NOTE — Addendum Note (Signed)
 Addended by: Ernestine Rohman M on: 12/20/2023 04:52 PM   Modules accepted: Orders

## 2023-12-24 ENCOUNTER — Ambulatory Visit: Admitting: Family

## 2023-12-24 VITALS — BP 116/82 | HR 83 | Temp 98.2°F | Resp 16 | Ht 63.0 in | Wt 184.0 lb

## 2023-12-24 DIAGNOSIS — F419 Anxiety disorder, unspecified: Secondary | ICD-10-CM

## 2023-12-24 MED ORDER — ALPRAZOLAM 0.5 MG PO TABS: 0.5000 mg | ORAL_TABLET | Freq: Two times a day (BID) | ORAL | 0 refills | Status: AC | PRN

## 2023-12-24 MED ORDER — DULOXETINE HCL 60 MG PO CPEP
60.0000 mg | ORAL_CAPSULE | Freq: Every day | ORAL | 3 refills | Status: DC
Start: 2023-12-24 — End: 2024-01-21

## 2023-12-24 NOTE — Progress Notes (Signed)
 Molly Gibbs is a 33 y.o. female with the following history as recorded in EpicCare:  Patient Active Problem List   Diagnosis Date Noted   Cervical radiculopathy    Left arm weakness    Atypical chest pain 07/27/2019   Mixed hyperlipidemia 07/27/2019   Tobacco use 04/09/2019   Dyslipidemia (high LDL; low HDL) 04/09/2019   Obesity (BMI 30-39.9) 04/09/2019   Chest pain of uncertain etiology 04/09/2019   Palpitations 04/09/2019   Knee pain 06/27/2012   Asthma 06/27/2011    Current Outpatient Medications  Medication Sig Dispense Refill   ALPRAZolam  (XANAX ) 0.5 MG tablet Take 1 tablet (0.5 mg total) by mouth 2 (two) times daily as needed for anxiety. 30 tablet 0   ibuprofen  (ADVIL ) 800 MG tablet Take 1 tablet (800 mg total) by mouth with breakfast, with lunch, and with evening meal. 90 tablet 0   DULoxetine  (CYMBALTA ) 60 MG capsule Take 1 capsule (60 mg total) by mouth daily. 90 capsule 3   No current facility-administered medications for this visit.    Allergies: Codeine, Other, and Imitrex  [sumatriptan ]  Past Medical History:  Diagnosis Date   Anemia    Anxiety    Asthma    asthmatic bronchitis no inhaler   Blood transfusion without reported diagnosis    Cervical radiculopathy    Chest pain of uncertain etiology    Complication of anesthesia    Depression    GERD (gastroesophageal reflux disease)    Hyperlipidemia    Obesity    PONV (postoperative nausea and vomiting)    with c section only   Tobacco use     Past Surgical History:  Procedure Laterality Date   CERVICAL DISC ARTHROPLASTY N/A 02/21/2022   Procedure: C5-6 ARTHROPLASTY;  Surgeon: Clois Fret, MD;  Location: [facility] ORS;  Service: Neurosurgery;  Laterality: N/A;   CESAREAN SECTION     x1   DILATION AND EVACUATION N/A 10/29/2012   Procedure: DILATATION AND EVACUATION WITH ULTRASOUND;  Surgeon: Burnard LABOR. Kandyce, MD;  Location: [facility];  Service: Gynecology;  Laterality: N/A;  CHROMOSOME STUDIES;POSSIBLE  MOLAR PREGNANCY.   Bladder instillation performed    Family History  Problem Relation Age of Onset   Arthritis Maternal Grandmother        Rheumatoid arthritis   Breast cancer Maternal Grandmother        breast   Heart disease Maternal Grandmother    Arthritis Maternal Grandfather    Arthritis Paternal Grandmother        Osteoarthritis   Heart disease Paternal Grandmother    Atrial fibrillation Paternal Grandmother    Arthritis Paternal Grandfather        Osteoarthritis   Lung cancer Paternal Grandfather        lung    Social History   Tobacco Use   Smoking status: Former    Current packs/day: 0.00    Average packs/day: 1 pack/day for 9.0 years (9.0 ttl pk-yrs)    Types: Cigarettes    Start date: 02/08/2013    Quit date: 02/08/2022    Years since quitting: 1.8   Smokeless tobacco: Never  Substance Use Topics   Alcohol use: Not Currently    Subjective:   Patient is requesting refill on her Cymbalta - she was doing very well on this medication and unfortunately there have been numerous errors regarding refill requests and she has had to go cold [place] off of this medication for the past few weeks; she would like to get back on her  medication;  She has also experienced a traumatic event at work and is having a very hard time dealing with her anxiety; she will be meeting with a trauma therapist but wonders about something else to help in the short term;    Objective:  Vitals:   12/24/23 0751  BP: 116/82  Pulse: 83  Resp: 16  Temp: 98.2 F (36.8 C)  TempSrc: Oral  SpO2: 97%  Weight: 184 lb (83.5 kg)  Height: 5' 3 (1.6 m)    General: Well developed, well nourished, in no acute distress  Skin : Warm and dry.  Head: Normocephalic and atraumatic  Lungs: Respirations unlabored; clear to auscultation bilaterally without wheeze, rales, rhonchi  CVS exam: normal rate and regular rhythm.  Neurologic: Alert and oriented; speech intact; face symmetrical; moves all  extremities well; CNII-XII intact without focal deficit   Assessment:  1. Anxiety     Plan:  Will re-start Cymbalta  60 mg that patient was doing well on; For situational anxiety, Rx for Xanax  0.5 mg bid prn; she will be meeting with trauma specialist as well;  Will check on patient later this week to see how she is doing/ make sure she is responding well to her treatment;   No follow-ups on file.  No orders of the defined types were placed in this encounter.   Requested Prescriptions   Signed Prescriptions Disp Refills   ALPRAZolam  (XANAX ) 0.5 MG tablet 30 tablet 0    Sig: Take 1 tablet (0.5 mg total) by mouth 2 (two) times daily as needed for anxiety.   DULoxetine  (CYMBALTA ) 60 MG capsule 90 capsule 3    Sig: Take 1 capsule (60 mg total) by mouth daily.

## 2023-12-24 NOTE — Patient Instructions (Signed)
 Please let me know if there is anything we can do at this time. Please message me directly in the future if you are having a hard time getting your refills. I am so sorry for the pain this whole situation has caused you.

## 2023-12-27 ENCOUNTER — Encounter: Payer: Self-pay | Admitting: Family

## 2024-01-01 DIAGNOSIS — F331 Major depressive disorder, recurrent, moderate: Secondary | ICD-10-CM | POA: Diagnosis not present

## 2024-01-01 DIAGNOSIS — F411 Generalized anxiety disorder: Secondary | ICD-10-CM | POA: Diagnosis not present

## 2024-01-21 ENCOUNTER — Other Ambulatory Visit: Payer: Self-pay | Admitting: Family

## 2024-01-29 DIAGNOSIS — F331 Major depressive disorder, recurrent, moderate: Secondary | ICD-10-CM | POA: Diagnosis not present

## 2024-01-29 DIAGNOSIS — F411 Generalized anxiety disorder: Secondary | ICD-10-CM | POA: Diagnosis not present

## 2024-04-29 DIAGNOSIS — F411 Generalized anxiety disorder: Secondary | ICD-10-CM | POA: Diagnosis not present

## 2024-04-29 DIAGNOSIS — F3341 Major depressive disorder, recurrent, in partial remission: Secondary | ICD-10-CM | POA: Diagnosis not present

## 2024-05-14 DIAGNOSIS — Z3200 Encounter for pregnancy test, result unknown: Secondary | ICD-10-CM | POA: Diagnosis not present

## 2024-05-25 DIAGNOSIS — Z3A00 Weeks of gestation of pregnancy not specified: Secondary | ICD-10-CM | POA: Diagnosis not present

## 2024-05-25 DIAGNOSIS — O209 Hemorrhage in early pregnancy, unspecified: Secondary | ICD-10-CM | POA: Diagnosis not present
